# Patient Record
Sex: Female | Born: 1970 | Race: Black or African American | Hispanic: No | Marital: Single | Smoking: Current every day smoker
Health system: Southern US, Community
[De-identification: ages and names within clinical notes are randomized; demographics above are authoritative.]

## PROBLEM LIST (undated history)

## (undated) DIAGNOSIS — K259 Gastric ulcer, unspecified as acute or chronic, without hemorrhage or perforation: Secondary | ICD-10-CM

## (undated) HISTORY — PX: FINGER SURGERY: SHX640

---

## 2019-03-06 ENCOUNTER — Emergency Department (HOSPITAL_COMMUNITY)
Admission: EM | Admit: 2019-03-06 | Discharge: 2019-03-06 | Disposition: A | Discharge status: Home / Self Care | Payer: Self-pay | Attending: Emergency Medicine | Admitting: Emergency Medicine

## 2019-03-06 ENCOUNTER — Emergency Department (HOSPITAL_COMMUNITY): Payer: Self-pay

## 2019-03-06 ENCOUNTER — Encounter (HOSPITAL_COMMUNITY): Payer: Self-pay

## 2019-03-06 ENCOUNTER — Other Ambulatory Visit: Payer: Self-pay

## 2019-03-06 DIAGNOSIS — R42 Dizziness and giddiness: Secondary | ICD-10-CM | POA: Insufficient documentation

## 2019-03-06 DIAGNOSIS — F419 Anxiety disorder, unspecified: Secondary | ICD-10-CM | POA: Insufficient documentation

## 2019-03-06 LAB — COMPREHENSIVE METABOLIC PANEL
ALT: 15 U/L (ref 0–44)
AST: 19 U/L (ref 15–41)
Albumin: 3 g/dL — ABNORMAL LOW (ref 3.5–5.0)
Alkaline Phosphatase: 46 U/L (ref 38–126)
Anion gap: 4 — ABNORMAL LOW (ref 5–15)
BUN: 11 mg/dL (ref 6–20)
CO2: 25 mmol/L (ref 22–32)
Calcium: 8.2 mg/dL — ABNORMAL LOW (ref 8.9–10.3)
Chloride: 107 mmol/L (ref 98–111)
Creatinine, Ser: 0.8 mg/dL (ref 0.44–1.00)
GFR calc Af Amer: >60 mL/min (ref >60)
GFR calc non Af Amer: >60 mL/min (ref >60)
Glucose, Bld: 90 mg/dL (ref 70–99)
Potassium: 4.1 mmol/L (ref 3.5–5.1)
Sodium: 136 mmol/L (ref 135–145)
Total Bilirubin: 0.7 mg/dL (ref 0.3–1.2)
Total Protein: 5.6 g/dL — ABNORMAL LOW (ref 6.5–8.1)

## 2019-03-06 LAB — CBC WITH DIFFERENTIAL/PLATELET
Abs Immature Granulocytes: 0.04 10*3/uL (ref 0.00–0.07)
Basophils Absolute: 0 10*3/uL (ref 0.0–0.1)
Basophils Relative: 0 %
Eosinophils Absolute: 0.1 10*3/uL (ref 0.0–0.5)
Eosinophils Relative: 1 %
HCT: 36.5 % (ref 36.0–46.0)
Hemoglobin: 12.2 g/dL (ref 12.0–15.0)
Immature Granulocytes: 0 %
Lymphocytes Relative: 15 %
Lymphs Abs: 1.8 10*3/uL (ref 0.7–4.0)
MCH: 29.7 pg (ref 26.0–34.0)
MCHC: 33.4 g/dL (ref 30.0–36.0)
MCV: 88.8 fL (ref 80.0–100.0)
Monocytes Absolute: 0.8 10*3/uL (ref 0.1–1.0)
Monocytes Relative: 6 %
Neutro Abs: 9.2 10*3/uL — ABNORMAL HIGH (ref 1.7–7.7)
Neutrophils Relative %: 78 %
Platelets: 297 10*3/uL (ref 150–400)
RBC: 4.11 MIL/uL (ref 3.87–5.11)
RDW: 14.7 % (ref 11.5–15.5)
WBC: 12 10*3/uL — ABNORMAL HIGH (ref 4.0–10.5)
nRBC: 0 % (ref 0.0–0.2)

## 2019-03-06 LAB — MAGNESIUM: Magnesium: 1.8 mg/dL (ref 1.7–2.4)

## 2019-03-06 LAB — I-STAT BETA HCG BLOOD, ED (MC, WL, AP ONLY): I-stat hCG, quantitative: <5 m[IU]/mL (ref <5)

## 2019-03-06 MED ORDER — SODIUM CHLORIDE 0.9 % IV BOLUS
1000.0000 mL | Freq: Once | INTRAVENOUS | Status: AC
  Administered 2019-03-06: 08:00:00 1000 mL via INTRAVENOUS

## 2019-03-06 MED ORDER — HYDROXYZINE HCL 25 MG PO TABS: 25.0000 mg | ORAL_TABLET | Freq: Four times a day (QID) | ORAL | 0 refills | Status: DC

## 2019-03-06 MED ORDER — METHOCARBAMOL 500 MG PO TABS: 500.0000 mg | ORAL_TABLET | Freq: Two times a day (BID) | ORAL | 0 refills | Status: DC

## 2019-03-06 MED ORDER — DICLOFENAC SODIUM 1 % EX GEL: 4.0000 g | Freq: Four times a day (QID) | CUTANEOUS | 2 refills | Status: DC

## 2019-03-06 NOTE — ED Provider Notes (Signed)
[facility] DEPT Provider Note   CSN: [id number] Arrival date & time: 03/06/19  0601     History Chief Complaint  Patient presents with  . Spasms  . Dizziness    Rebecca Fields is a 49 y.o. female.  HPI      Rebecca Fields is a 49 y.o. female, patient with no pertinent past medical history, presenting to the ED with lightheadedness that began this morning upon getting out of bed.  She notes symptoms were worse with standing and improved with lying down.  She has occasional sensation of room spinning, but this is not constant.  She mentions there is also a feeling of anxiety that worsens her symptoms and she feels better when she is able to take deep breaths and calm herself.  She reports she has been under a great deal of stress the last several months and after talking with the patient for some time, she finally tells me, "I actually think my symptoms are from anxiety." Denies SI/HI. She notes anterior lower leg cramping that typically occurs at night and has been recurrent over the last several months. Regularly donates plasma and episodes have occurred surrounding donating previously. Denies fever/chills, recent illness, chest pain, shortness of breath, cough, abdominal pain, N/V/D, urinary symptoms, falls/trauma, vision abnormalities, numbness, weakness, headaches, pain in the lower extremities with walking, or any other complaints.  History reviewed. No pertinent past medical history.  There are no problems to display for this patient.   History reviewed. No pertinent surgical history.   OB History   No obstetric history on file.     History reviewed. No pertinent family history.  Social History   Tobacco Use  . Smoking status: Not on file  Substance Use Topics  . Alcohol use: Not on file  . Drug use: Not on file    Home Medications Prior to Admission medications   Medication Sig Start Date End Date Taking? Authorizing Provider    Ascorbic Acid (VITAMIN C PO) Take 1 tablet by mouth daily as needed (For immune system.).   Yes [provider]  ibuprofen (ADVIL) 200 MG tablet Take 800-1,000 mg by mouth every 6 (six) hours as needed for headache or mild pain.    Yes [provider]  Menthol, Topical Analgesic, (BIOFREEZE EX) Apply 1 patch topically daily as needed (For pain.). Biofreeze Patch   Yes [provider]  Menthol, Topical Analgesic, (BIOFREEZE EX) Apply 1 application topically daily as needed (For pain.). Biofreeze Cream   Yes [provider]  Multiple Vitamin (MULTIVITAMIN WITH MINERALS) TABS tablet Take 1 tablet by mouth daily.   Yes [provider]  diclofenac Sodium (VOLTAREN) 1 % GEL Apply 4 g topically 4 (four) times daily. 03/06/19   Dhanvin Szeto C, PA-C  hydrOXYzine (ATARAX/VISTARIL) 25 MG tablet Take 1 tablet (25 mg total) by mouth every 6 (six) hours. 03/06/19   Holden Maniscalco C, PA-C  methocarbamol (ROBAXIN) 500 MG tablet Take 1 tablet (500 mg total) by mouth 2 (two) times daily. 03/06/19   Moustafa Mossa C, PA-C    Allergies    Patient has no known allergies.  Review of Systems   Review of Systems  Constitutional: Negative for chills and fever.  Eyes: Negative for visual disturbance.  Respiratory: Negative for cough and shortness of breath.   Cardiovascular: Negative for chest pain.  Gastrointestinal: Negative for abdominal pain, blood in stool, diarrhea, nausea and vomiting.  Genitourinary: Negative for dysuria, flank pain, frequency and  hematuria.  Musculoskeletal: Positive for myalgias.  Neurological: Positive for dizziness and light-headedness. Negative for seizures, syncope, facial asymmetry, speech difficulty, weakness, numbness and headaches.  All other systems reviewed and are negative.   Physical Exam Updated Vital Signs BP (!) 151/88 (BP Location: Left Arm)   Pulse 71   Temp 98.1 F (36.7 C) (Oral)   Resp 16   Ht 5\' 5"  (1.651 m)   Wt 93.4 kg   SpO2  100%   BMI 34.28 kg/m   Physical Exam Vitals and nursing note reviewed.  Constitutional:      General: She is not in acute distress.    Appearance: She is well-developed. She is not diaphoretic.  HENT:     Head: Normocephalic and atraumatic.     Mouth/Throat:     Mouth: Mucous membranes are moist.     Pharynx: Oropharynx is clear.  Eyes:     Conjunctiva/sclera: Conjunctivae normal.  Cardiovascular:     Rate and Rhythm: Normal rate and regular rhythm.     Pulses: Normal pulses.          Radial pulses are 2+ on the right side and 2+ on the left side.       Dorsalis pedis pulses are 2+ on the right side and 2+ on the left side.       Posterior tibial pulses are 2+ on the right side and 2+ on the left side.     Heart sounds: Normal heart sounds.     Comments: Tactile temperature in the extremities appropriate and equal bilaterally. Pulmonary:     Effort: Pulmonary effort is normal. No respiratory distress.     Breath sounds: Normal breath sounds.  Abdominal:     Palpations: Abdomen is soft.     Tenderness: There is no abdominal tenderness. There is no guarding.  Musculoskeletal:     Cervical back: Neck supple.     Right lower leg: No edema.     Left lower leg: No edema.     Comments: No calf tenderness, erythema, edema, or increased warmth.  Lymphadenopathy:     Cervical: No cervical adenopathy.  Skin:    General: Skin is warm and dry.  Neurological:     Mental Status: She is alert and oriented to person, place, and time.     Comments: No noted acute cognitive deficit. Sensation grossly intact to light touch in the extremities.   Grip strengths equal bilaterally.   Strength 5/5 in all extremities.  No gait disturbance.  Coordination intact.  Cranial nerves III-XII grossly intact.  Handles oral secretions without noted difficulty.  No noted phonation or speech deficit. No facial droop.   HINTS-Plus Exam Head Impulse: Abnormal, thus reassuring Nystagmus: No nystagmus  noted. No bidirectional, rotation, or vertical nystagmus noted. Test of Skew: No abnormal skew Hearing: No noted acute hearing deficit with finger rub testing    Psychiatric:        Mood and Affect: Mood and affect normal.        Speech: Speech normal.        Behavior: Behavior normal.     ED Results / Procedures / Treatments   Labs (all labs ordered are listed, but only abnormal results are displayed) Labs Reviewed  CBC WITH DIFFERENTIAL/PLATELET - Abnormal; Notable for the following components:      Result Value   WBC 12.0 (*)    Neutro Abs 9.2 (*)    All other components within normal limits  COMPREHENSIVE METABOLIC  PANEL - Abnormal; Notable for the following components:   Calcium 8.2 (*)    Total Protein 5.6 (*)    Albumin 3.0 (*)    Anion gap 4 (*)    All other components within normal limits  MAGNESIUM  I-STAT BETA HCG BLOOD, ED ([facility], [facility], [facility] ONLY)    EKG EKG Interpretation  Date/Time:  Tuesday March 06 2019 07:22:44 EST Ventricular Rate:  63 PR Interval:    QRS Duration: 93 QT Interval:  432 QTC Calculation: 443 R Axis:   35 Text Interpretation: Sinus rhythm No significant change since last tracing Confirmed by Gwyneth Sprout ([id number]) on 03/06/2019 7:37:31 AM   Radiology CT Head Wo Contrast  Result Date: 03/06/2019 CLINICAL DATA:  Dizziness. EXAM: CT HEAD WITHOUT CONTRAST TECHNIQUE: Contiguous axial images were obtained from the base of the skull through the vertex without intravenous contrast. COMPARISON:  None. FINDINGS: Brain: Normal appearing cerebral hemispheres and posterior fossa structures. Normal size and position of the ventricles. No intracranial hemorrhage, mass lesion or CT evidence of acute infarction. Vascular: No hyperdense vessel or unexpected calcification. Skull: Normal. Negative for fracture or focal lesion. Sinuses/Orbits: Unremarkable. Other: None. IMPRESSION: Normal examination. Electronically Signed   By: Beckie Salts M.D.   On: 03/06/2019  10:40    Procedures Procedures (including critical care time)  Medications Ordered in ED Medications  sodium chloride 0.9 % bolus 1,000 mL (0 mLs Intravenous Stopped 03/06/19 1119)    ED Course  I have reviewed the triage vital signs and the nursing notes.  Pertinent labs & imaging results that were available during my care of the patient were reviewed by me and considered in my medical decision making (see chart for details).  Clinical Course as of Mar 06 1152  Tue Mar 06, 2019  0240 Patient states she feels much better.   [SJ]    Clinical Course User Index [SJ] Noelia Lenart, Hillard Danker, PA-C   MDM Rules/Calculators/A&P                      Patient presents with episodes of anxiety and positional lightheadedness. Patient is nontoxic appearing, afebrile, not tachycardic, not tachypneic, not hypotensive, maintains excellent SPO2 on room air, and is in no apparent distress.  Work-up here overall reassuring. Discussed anxiety. Has been trying to use relaxation techniques and mindfulness exercises. We discussed additionally seeing a PCP and a counselor on this matter.  The patient was given instructions for home care as well as return precautions. Patient voices understanding of these instructions, accepts the plan, and is comfortable with discharge.   Vitals:   03/06/19 0611 03/06/19 0647 03/06/19 0723  BP: (!) 151/88  (!) 122/100  Pulse: 71  64  Resp: 16  18  Temp: 98.1 F (36.7 C)    TempSrc: Oral    SpO2: 100%  100%  Weight:  93.4 kg   Height:  5\' 5"  (1.651 m)    Orthostatic vital signs reassuring, but would not import into my note.   Orthostatic Vital Signs Orthostatic Lying  BP- Lying:158/91 (!) Pulse- Lying:65 Orthostatic Sitting BP- Sitting:167/83 Pulse- Sitting:64 Orthostatic Standing at 0 minutes BP- Standing at 0 minutes:160/98 (!) Pulse- Standing at 0 minutes:68 Orthostatic Standing at 3 minutes BP- Standing at 3 minutes:179/88 Pulse- Standing at 3  minutes:63  Final Clinical Impression(s) / ED Diagnoses Final diagnoses:  Lightheadedness  Anxiousness    Rx / DC Orders ED Discharge Orders         Ordered  diclofenac Sodium (VOLTAREN) 1 % GEL  4 times daily     03/06/19 1052    methocarbamol (ROBAXIN) 500 MG tablet  2 times daily     03/06/19 1052    hydrOXYzine (ATARAX/VISTARIL) 25 MG tablet  Every 6 hours     03/06/19 1052           Anselm Pancoast, PA-C 03/07/19 1156    Gwyneth Sprout, MD 03/09/19 1644

## 2019-03-06 NOTE — ED Notes (Signed)
Provider at bedside

## 2019-03-06 NOTE — ED Triage Notes (Signed)
Pt states that she has been having muscle spasms at night lately. She took 800mg  of ibuprofen last night before bed and woke up feeling hot and diaphoretic with a stomach ache. She states that, about a month ago, one of these "spells" happened and caused her to fall. She states that she felt a little better after a cold shower, but still doesn't feel right. A&Ox4. Ambulatory.

## 2019-03-06 NOTE — ED Notes (Signed)
Pt ambulated to the restroom independently without issues.  

## 2019-03-06 NOTE — Discharge Instructions (Addendum)
  Your work-up today was reassuring. For your lightheadedness, we recommend staying well-hydrated and eating regular meals. Try to reduce stress as much as possible.  This may include removing yourself from a stressful situation, avoiding electronics or social media, etc. Recommend involving yourself in counseling or speaking with a therapist.  These can typically be set up through a PCP. It would be helpful to also start to include physical activity in your daily regimen.  Exercise has been noted to decrease stress and anxiety. Hydroxyzine: May use the hydroxyzine, as needed, for anxiety.  Use caution as hydroxyzine can make you drowsy. It is very important to get good sleep.  For most adults, this means at least 7 hours of continuous sleep a night.  Avoiding stress and screens, such as phones and television before sleep can help facilitate this.  Leg pain and cramping: Diclofenac gel: This is a topical anti-inflammatory medication and can be applied directly to the painful region.  Do not use on the face or genitals.  This medication may be used as an alternative to oral anti-inflammatory medications, such as ibuprofen or naproxen. Methocarbamol: Methocarbamol (generic for Robaxin) is a muscle relaxer and can help relieve stiff muscles or muscle spasms.  Do not drive or perform other dangerous activities while taking this medication as it can cause drowsiness as well as changes in reaction time and judgement.

## 2019-03-06 NOTE — ED Notes (Signed)
Pt ambulated independently to/from CT with no issues.

## 2019-03-19 NOTE — Progress Notes (Signed)
Patient ID: Rebecca Fields, female   DOB: [birth year], 49 y.o.   MRN: [id number]  Virtual Visit via Telephone Note  I connected with Julaine Fusi on 03/21/19 at  2:30 PM EST by telephone and verified that I am speaking with the correct person using two identifiers.   I discussed the limitations, risks, security and privacy concerns of performing an evaluation and management service by telephone and the availability of in person appointments. I also discussed with the patient that there may be a patient responsible charge related to this service. The patient expressed understanding and agreed to proceed.   History of Present Illness: after ED visit for dizziness 03/06/2019.  Work-up unremarkable.  No further episodes.  No CP.    Today she is complaining of L leg pain from the knee down.  It started around November when she started working and having to stand on a concrete floor.  When she was off for a couple of weeks the pain improved.  The pain is along the front part of her lower leg(not in the back of the leg).  No calf pain or tenderness.  She was unable to get Rx from ED filled due to cost.   Hasn't checked BP OOO but was high in ED.    Donates plasma twice a week.    From ED A/P: Patient presents with episodes of anxiety and positional lightheadedness. Patient is nontoxic appearing, afebrile, not tachycardic, not tachypneic, not hypotensive, maintains excellent SPO2 on room air, and is in no apparent distress.  Work-up here overall reassuring. Discussed anxiety. Has been trying to use relaxation techniques and mindfulness exercises. We discussed additionally seeing a PCP and a counselor on this matter.    Observations/Objective:  NAD.  A&Ox3   Assessment and Plan: 1. Pain of left lower extremity Sent new Rx to our pharmacy so she can pick it up-methocarbamol and topical voltaren.  Also advised tylenol.    2. Elevated blood pressure reading Check BP at least 3 times weekly and record and  bring to next visit.    3. Hospital discharge follow-up I advised her to stop plasma donations for the time being and see if the dizziness episodes resolve.  Also, she should make sure and drink ample water.     Follow Up Instructions: Assign PCP 4-6 weeks   I discussed the assessment and treatment plan with the patient. The patient was provided an opportunity to ask questions and all were answered. The patient agreed with the plan and demonstrated an understanding of the instructions.   The patient was advised to call back or seek an in-person evaluation if the symptoms worsen or if the condition fails to improve as anticipated.  I provided 15 minutes of non-face-to-face time during this encounter.   Georgian Co, PA-C

## 2019-03-21 ENCOUNTER — Other Ambulatory Visit: Payer: Self-pay

## 2019-03-21 ENCOUNTER — Ambulatory Visit: Payer: Self-pay | Attending: Family Medicine | Admitting: Physician Assistant

## 2019-03-21 DIAGNOSIS — R030 Elevated blood-pressure reading, without diagnosis of hypertension: Secondary | ICD-10-CM

## 2019-03-21 DIAGNOSIS — R42 Dizziness and giddiness: Secondary | ICD-10-CM

## 2019-03-21 DIAGNOSIS — M79605 Pain in left leg: Secondary | ICD-10-CM

## 2019-03-21 DIAGNOSIS — Z09 Encounter for follow-up examination after completed treatment for conditions other than malignant neoplasm: Secondary | ICD-10-CM

## 2019-03-21 MED ORDER — METHOCARBAMOL 500 MG PO TABS: 1000.0000 mg | ORAL_TABLET | Freq: Three times a day (TID) | ORAL | 0 refills | Status: DC | PRN

## 2019-03-21 MED ORDER — DICLOFENAC SODIUM 1 % EX GEL: 4.0000 g | Freq: Four times a day (QID) | CUTANEOUS | 2 refills | Status: DC

## 2019-03-21 MED FILL — DICLOFENAC SODIUM 1 % GEL: 1 | 6 days supply | Qty: 100 | Fill #0

## 2019-03-21 MED FILL — METHOCARBAMOL 500 MG TABS: 500 | 15 days supply | Qty: 90 | Fill #0

## 2019-03-21 NOTE — Progress Notes (Signed)
Hospital F /u   c /o muscle spasms throughout the day in left leg with swelling on and off Patient stated Left leg is bigger than the right one

## 2020-03-24 ENCOUNTER — Other Ambulatory Visit: Payer: Self-pay

## 2020-03-24 ENCOUNTER — Ambulatory Visit: Payer: Self-pay | Attending: Internal Medicine | Admitting: Internal Medicine

## 2020-04-03 ENCOUNTER — Other Ambulatory Visit: Payer: Self-pay

## 2020-04-03 ENCOUNTER — Emergency Department (HOSPITAL_BASED_OUTPATIENT_CLINIC_OR_DEPARTMENT_OTHER): Payer: Self-pay

## 2020-04-03 ENCOUNTER — Encounter (HOSPITAL_COMMUNITY)
Admission: EM | Disposition: A | Discharge status: Home / Self Care | Payer: Self-pay | Source: Home / Self Care | Attending: Emergency Medicine

## 2020-04-03 ENCOUNTER — Observation Stay (HOSPITAL_BASED_OUTPATIENT_CLINIC_OR_DEPARTMENT_OTHER)
Admission: EM | Admit: 2020-04-03 | Discharge: 2020-04-04 | Disposition: A | Discharge status: Home / Self Care | Payer: Self-pay | Attending: Surgery | Admitting: Surgery

## 2020-04-03 ENCOUNTER — Emergency Department (HOSPITAL_COMMUNITY): Payer: Self-pay | Admitting: Anesthesiology

## 2020-04-03 ENCOUNTER — Emergency Department (HOSPITAL_COMMUNITY)
Admission: EM | Admit: 2020-04-03 | Discharge: 2020-04-03 | Disposition: A | Discharge status: Home / Self Care | Payer: Self-pay | Attending: Emergency Medicine | Admitting: Emergency Medicine

## 2020-04-03 ENCOUNTER — Encounter (HOSPITAL_BASED_OUTPATIENT_CLINIC_OR_DEPARTMENT_OTHER): Payer: Self-pay

## 2020-04-03 ENCOUNTER — Encounter (HOSPITAL_COMMUNITY): Payer: Self-pay

## 2020-04-03 DIAGNOSIS — F17200 Nicotine dependence, unspecified, uncomplicated: Secondary | ICD-10-CM | POA: Insufficient documentation

## 2020-04-03 DIAGNOSIS — R1110 Vomiting, unspecified: Secondary | ICD-10-CM | POA: Insufficient documentation

## 2020-04-03 DIAGNOSIS — Z5321 Procedure and treatment not carried out due to patient leaving prior to being seen by health care provider: Secondary | ICD-10-CM | POA: Insufficient documentation

## 2020-04-03 DIAGNOSIS — K3580 Unspecified acute appendicitis: Principal | ICD-10-CM | POA: Diagnosis present

## 2020-04-03 DIAGNOSIS — Z20822 Contact with and (suspected) exposure to covid-19: Secondary | ICD-10-CM | POA: Insufficient documentation

## 2020-04-03 DIAGNOSIS — K353 Acute appendicitis with localized peritonitis, without perforation or gangrene: Secondary | ICD-10-CM

## 2020-04-03 DIAGNOSIS — Z79899 Other long term (current) drug therapy: Secondary | ICD-10-CM | POA: Insufficient documentation

## 2020-04-03 DIAGNOSIS — R109 Unspecified abdominal pain: Secondary | ICD-10-CM | POA: Insufficient documentation

## 2020-04-03 HISTORY — DX: Gastric ulcer, unspecified as acute or chronic, without hemorrhage or perforation: K25.9

## 2020-04-03 HISTORY — PX: LAPAROSCOPIC APPENDECTOMY: SHX408

## 2020-04-03 LAB — URINALYSIS, ROUTINE W REFLEX MICROSCOPIC
Bilirubin Urine: NEGATIVE
Glucose, UA: NEGATIVE mg/dL
Hgb urine dipstick: NEGATIVE
Ketones, ur: NEGATIVE mg/dL
Leukocytes,Ua: NEGATIVE
Nitrite: POSITIVE — AB
Protein, ur: NEGATIVE mg/dL
Specific Gravity, Urine: 1.013 (ref 1.005–1.030)
pH: 6 (ref 5.0–8.0)

## 2020-04-03 LAB — CBC
HCT: 38.4 % (ref 36.0–46.0)
Hemoglobin: 12.8 g/dL (ref 12.0–15.0)
MCH: 29.8 pg (ref 26.0–34.0)
MCHC: 33.3 g/dL (ref 30.0–36.0)
MCV: 89.3 fL (ref 80.0–100.0)
Platelets: 389 10*3/uL (ref 150–400)
RBC: 4.3 MIL/uL (ref 3.87–5.11)
RDW: 14.6 % (ref 11.5–15.5)
WBC: 17.3 10*3/uL — ABNORMAL HIGH (ref 4.0–10.5)
nRBC: 0 % (ref 0.0–0.2)

## 2020-04-03 LAB — COMPREHENSIVE METABOLIC PANEL
ALT: 13 U/L (ref 0–44)
AST: 17 U/L (ref 15–41)
Albumin: 3.4 g/dL — ABNORMAL LOW (ref 3.5–5.0)
Alkaline Phosphatase: 54 U/L (ref 38–126)
Anion gap: 7 (ref 5–15)
BUN: 8 mg/dL (ref 6–20)
CO2: 26 mmol/L (ref 22–32)
Calcium: 8.6 mg/dL — ABNORMAL LOW (ref 8.9–10.3)
Chloride: 104 mmol/L (ref 98–111)
Creatinine, Ser: 0.81 mg/dL (ref 0.44–1.00)
GFR, Estimated: >60 mL/min (ref >60)
Glucose, Bld: 115 mg/dL — ABNORMAL HIGH (ref 70–99)
Potassium: 4 mmol/L (ref 3.5–5.1)
Sodium: 137 mmol/L (ref 135–145)
Total Bilirubin: 0.8 mg/dL (ref 0.3–1.2)
Total Protein: 6.4 g/dL — ABNORMAL LOW (ref 6.5–8.1)

## 2020-04-03 LAB — I-STAT BETA HCG BLOOD, ED (MC, WL, AP ONLY): I-stat hCG, quantitative: <5 m[IU]/mL (ref <5)

## 2020-04-03 LAB — RESP PANEL BY RT-PCR (FLU A&B, COVID) ARPGX2
Influenza A by PCR: NEGATIVE
Influenza B by PCR: NEGATIVE
SARS Coronavirus 2 by RT PCR: NEGATIVE

## 2020-04-03 LAB — LIPASE, BLOOD: Lipase: 25 U/L (ref 11–51)

## 2020-04-03 LAB — HIV ANTIBODY (ROUTINE TESTING W REFLEX): HIV Screen 4th Generation wRfx: NONREACTIVE

## 2020-04-03 SURGERY — APPENDECTOMY, LAPAROSCOPIC
Anesthesia: General | Site: Abdomen

## 2020-04-03 MED ORDER — MORPHINE SULFATE (PF) 2 MG/ML IV SOLN
2.0000 mg | INTRAVENOUS | Status: DC | PRN
  Administered 2020-04-03 – 2020-04-04 (×2): 2 mg via INTRAVENOUS
  Filled 2020-04-03 (×2): qty 1

## 2020-04-03 MED ORDER — ROCURONIUM BROMIDE 10 MG/ML (PF) SYRINGE
PREFILLED_SYRINGE | INTRAVENOUS | Status: DC | PRN
  Administered 2020-04-03: 60 mg via INTRAVENOUS

## 2020-04-03 MED ORDER — ACETAMINOPHEN 10 MG/ML IV SOLN
INTRAVENOUS | Status: AC
  Filled 2020-04-03: qty 100

## 2020-04-03 MED ORDER — ENOXAPARIN SODIUM 40 MG/0.4ML ~~LOC~~ SOLN
40.0000 mg | SUBCUTANEOUS | Status: DC
  Administered 2020-04-04: 40 mg via SUBCUTANEOUS
  Filled 2020-04-03: qty 0.4

## 2020-04-03 MED ORDER — SIMETHICONE 80 MG PO CHEW: 40.0000 mg | CHEWABLE_TABLET | Freq: Four times a day (QID) | ORAL | Status: DC | PRN

## 2020-04-03 MED ORDER — DIPHENHYDRAMINE HCL 50 MG/ML IJ SOLN: 25.0000 mg | Freq: Four times a day (QID) | INTRAMUSCULAR | Status: DC | PRN

## 2020-04-03 MED ORDER — SODIUM CHLORIDE 0.9 % IV SOLN: INTRAVENOUS | Status: DC

## 2020-04-03 MED ORDER — BUPIVACAINE HCL (PF) 0.25 % IJ SOLN
INTRAMUSCULAR | Status: DC | PRN
  Administered 2020-04-03: 20 mL

## 2020-04-03 MED ORDER — SUGAMMADEX SODIUM 200 MG/2ML IV SOLN
INTRAVENOUS | Status: DC | PRN
  Administered 2020-04-03 (×2): 100 mg via INTRAVENOUS
  Administered 2020-04-03: 150 mg via INTRAVENOUS
  Administered 2020-04-03: 100 mg via INTRAVENOUS

## 2020-04-03 MED ORDER — METRONIDAZOLE IN NACL 5-0.79 MG/ML-% IV SOLN
500.0000 mg | Freq: Once | INTRAVENOUS | Status: AC
  Administered 2020-04-03: 500 mg via INTRAVENOUS
  Filled 2020-04-03: qty 100

## 2020-04-03 MED ORDER — FENTANYL CITRATE (PF) 250 MCG/5ML IJ SOLN
INTRAMUSCULAR | Status: AC
  Filled 2020-04-03: qty 5

## 2020-04-03 MED ORDER — ACETAMINOPHEN 10 MG/ML IV SOLN
INTRAVENOUS | Status: DC | PRN
  Administered 2020-04-03: 1000 mg via INTRAVENOUS

## 2020-04-03 MED ORDER — SODIUM CHLORIDE 0.9 % IV SOLN
1.0000 g | Freq: Once | INTRAVENOUS | Status: AC
  Administered 2020-04-03: 1 g via INTRAVENOUS
  Filled 2020-04-03: qty 10

## 2020-04-03 MED ORDER — ONDANSETRON HCL 4 MG/2ML IJ SOLN
INTRAMUSCULAR | Status: AC
  Filled 2020-04-03: qty 2

## 2020-04-03 MED ORDER — FENTANYL CITRATE (PF) 100 MCG/2ML IJ SOLN
25.0000 ug | INTRAMUSCULAR | Status: DC | PRN
Start: 2020-04-03 — End: 2020-04-03

## 2020-04-03 MED ORDER — METOPROLOL TARTRATE 5 MG/5ML IV SOLN: 5.0000 mg | Freq: Four times a day (QID) | INTRAVENOUS | Status: DC | PRN

## 2020-04-03 MED ORDER — ONDANSETRON HCL 4 MG/2ML IJ SOLN
4.0000 mg | Freq: Once | INTRAMUSCULAR | Status: AC
  Administered 2020-04-03: 4 mg via INTRAVENOUS
  Filled 2020-04-03: qty 2

## 2020-04-03 MED ORDER — DEXAMETHASONE SODIUM PHOSPHATE 10 MG/ML IJ SOLN
INTRAMUSCULAR | Status: AC
  Filled 2020-04-03: qty 1

## 2020-04-03 MED ORDER — PANTOPRAZOLE SODIUM 40 MG IV SOLR
40.0000 mg | Freq: Once | INTRAVENOUS | Status: AC
  Administered 2020-04-03: 40 mg via INTRAVENOUS
  Filled 2020-04-03: qty 40

## 2020-04-03 MED ORDER — SCOPOLAMINE 1 MG/3DAYS TD PT72
MEDICATED_PATCH | TRANSDERMAL | Status: DC | PRN
  Administered 2020-04-03: 1 via TRANSDERMAL

## 2020-04-03 MED ORDER — BUPIVACAINE HCL (PF) 0.25 % IJ SOLN
INTRAMUSCULAR | Status: AC
  Filled 2020-04-03: qty 30

## 2020-04-03 MED ORDER — SODIUM CHLORIDE 0.9 % IV SOLN
1.0000 g | INTRAVENOUS | Status: AC
  Administered 2020-04-03: 1 g via INTRAVENOUS

## 2020-04-03 MED ORDER — SCOPOLAMINE 1 MG/3DAYS TD PT72
1.0000 | MEDICATED_PATCH | TRANSDERMAL | Status: DC
  Filled 2020-04-03: qty 1

## 2020-04-03 MED ORDER — ONDANSETRON HCL 4 MG/2ML IJ SOLN
INTRAMUSCULAR | Status: DC | PRN
  Administered 2020-04-03: 4 mg via INTRAVENOUS

## 2020-04-03 MED ORDER — OXYCODONE HCL 5 MG PO TABS: 5.0000 mg | ORAL_TABLET | Freq: Once | ORAL | Status: DC | PRN

## 2020-04-03 MED ORDER — POLYETHYLENE GLYCOL 3350 17 G PO PACK: 17.0000 g | PACK | Freq: Every day | ORAL | Status: DC | PRN

## 2020-04-03 MED ORDER — PANTOPRAZOLE SODIUM 40 MG IV SOLR
40.0000 mg | Freq: Every day | INTRAVENOUS | Status: DC
  Administered 2020-04-03: 40 mg via INTRAVENOUS
  Filled 2020-04-03: qty 40

## 2020-04-03 MED ORDER — DOCUSATE SODIUM 100 MG PO CAPS
100.0000 mg | ORAL_CAPSULE | Freq: Two times a day (BID) | ORAL | Status: DC
  Administered 2020-04-03: 100 mg via ORAL
  Filled 2020-04-03: qty 1

## 2020-04-03 MED ORDER — PROPOFOL 10 MG/ML IV BOLUS
INTRAVENOUS | Status: AC
  Filled 2020-04-03: qty 40

## 2020-04-03 MED ORDER — SUCCINYLCHOLINE CHLORIDE 200 MG/10ML IV SOSY
PREFILLED_SYRINGE | INTRAVENOUS | Status: DC | PRN
  Administered 2020-04-03: 100 mg via INTRAVENOUS

## 2020-04-03 MED ORDER — OXYCODONE HCL 5 MG/5ML PO SOLN: 5.0000 mg | Freq: Once | ORAL | Status: DC | PRN

## 2020-04-03 MED ORDER — ACETAMINOPHEN 500 MG PO TABS
1000.0000 mg | ORAL_TABLET | Freq: Four times a day (QID) | ORAL | Status: DC | PRN
  Administered 2020-04-04: 1000 mg via ORAL
  Filled 2020-04-03: qty 2

## 2020-04-03 MED ORDER — LACTATED RINGERS IV SOLN: INTRAVENOUS | Status: DC | PRN

## 2020-04-03 MED ORDER — MORPHINE SULFATE (PF) 4 MG/ML IV SOLN
4.0000 mg | Freq: Once | INTRAVENOUS | Status: AC
  Administered 2020-04-03: 4 mg via INTRAVENOUS
  Filled 2020-04-03: qty 1

## 2020-04-03 MED ORDER — SODIUM CHLORIDE 0.9 % IV SOLN
1.0000 g | Freq: Once | INTRAVENOUS | Status: DC
  Filled 2020-04-03: qty 10

## 2020-04-03 MED ORDER — LACTATED RINGERS IV BOLUS
1000.0000 mL | Freq: Once | INTRAVENOUS | Status: AC
  Administered 2020-04-03: 1000 mL via INTRAVENOUS

## 2020-04-03 MED ORDER — PROMETHAZINE HCL 25 MG/ML IJ SOLN: 6.2500 mg | INTRAMUSCULAR | Status: DC | PRN

## 2020-04-03 MED ORDER — MIDAZOLAM HCL 2 MG/2ML IJ SOLN
INTRAMUSCULAR | Status: DC | PRN
  Administered 2020-04-03: 2 mg via INTRAVENOUS

## 2020-04-03 MED ORDER — KETOROLAC TROMETHAMINE 30 MG/ML IJ SOLN
INTRAMUSCULAR | Status: DC | PRN
  Administered 2020-04-03: 30 mg via INTRAVENOUS

## 2020-04-03 MED ORDER — KETOROLAC TROMETHAMINE 30 MG/ML IJ SOLN
INTRAMUSCULAR | Status: AC
  Filled 2020-04-03: qty 1

## 2020-04-03 MED ORDER — IOHEXOL 300 MG/ML  SOLN
100.0000 mL | Freq: Once | INTRAMUSCULAR | Status: AC | PRN
  Administered 2020-04-03: 100 mL via INTRAVENOUS

## 2020-04-03 MED ORDER — OXYCODONE HCL 5 MG PO TABS
5.0000 mg | ORAL_TABLET | ORAL | Status: DC | PRN
  Administered 2020-04-03: 10 mg via ORAL
  Filled 2020-04-03: qty 2

## 2020-04-03 MED ORDER — LIDOCAINE 2% (20 MG/ML) 5 ML SYRINGE
INTRAMUSCULAR | Status: DC | PRN
  Administered 2020-04-03: 60 mg via INTRAVENOUS
  Administered 2020-04-03: 40 mg via INTRAVENOUS

## 2020-04-03 MED ORDER — KETOROLAC TROMETHAMINE 30 MG/ML IJ SOLN: 30.0000 mg | Freq: Once | INTRAMUSCULAR | Status: DC

## 2020-04-03 MED ORDER — DEXMEDETOMIDINE (PRECEDEX) IN NS 20 MCG/5ML (4 MCG/ML) IV SYRINGE
PREFILLED_SYRINGE | INTRAVENOUS | Status: DC | PRN
  Administered 2020-04-03: 4 ug via INTRAVENOUS

## 2020-04-03 MED ORDER — ACETAMINOPHEN 500 MG PO TABS: 1000.0000 mg | ORAL_TABLET | Freq: Four times a day (QID) | ORAL | Status: DC

## 2020-04-03 MED ORDER — FENTANYL CITRATE (PF) 250 MCG/5ML IJ SOLN
INTRAMUSCULAR | Status: DC | PRN
  Administered 2020-04-03: 50 ug via INTRAVENOUS
  Administered 2020-04-03: 150 ug via INTRAVENOUS

## 2020-04-03 MED ORDER — DIPHENHYDRAMINE HCL 25 MG PO CAPS
25.0000 mg | ORAL_CAPSULE | Freq: Four times a day (QID) | ORAL | Status: DC | PRN
Start: 2020-04-03 — End: 2020-04-04

## 2020-04-03 MED ORDER — ONDANSETRON HCL 4 MG/2ML IJ SOLN: 4.0000 mg | Freq: Four times a day (QID) | INTRAMUSCULAR | Status: DC | PRN

## 2020-04-03 MED ORDER — MIDAZOLAM HCL 2 MG/2ML IJ SOLN
INTRAMUSCULAR | Status: AC
  Filled 2020-04-03: qty 2

## 2020-04-03 MED ORDER — PROPOFOL 10 MG/ML IV BOLUS
INTRAVENOUS | Status: DC | PRN
  Administered 2020-04-03: 200 mg via INTRAVENOUS

## 2020-04-03 MED ORDER — ROCURONIUM BROMIDE 10 MG/ML (PF) SYRINGE
PREFILLED_SYRINGE | INTRAVENOUS | Status: AC
  Filled 2020-04-03: qty 10

## 2020-04-03 MED ORDER — HYDROMORPHONE HCL 1 MG/ML IJ SOLN: 0.5000 mg | Freq: Once | INTRAMUSCULAR | Status: DC

## 2020-04-03 MED ORDER — DEXAMETHASONE SODIUM PHOSPHATE 10 MG/ML IJ SOLN
INTRAMUSCULAR | Status: DC | PRN
  Administered 2020-04-03: 5 mg via INTRAVENOUS

## 2020-04-03 MED ORDER — SODIUM CHLORIDE 0.9 % IR SOLN
Status: DC | PRN
  Administered 2020-04-03: 1000 mL

## 2020-04-03 MED ORDER — LIDOCAINE 2% (20 MG/ML) 5 ML SYRINGE
INTRAMUSCULAR | Status: AC
  Filled 2020-04-03: qty 5

## 2020-04-03 MED ORDER — ACETAMINOPHEN 10 MG/ML IV SOLN: 1000.0000 mg | Freq: Once | INTRAVENOUS | Status: DC | PRN

## 2020-04-03 MED ORDER — METHOCARBAMOL 500 MG PO TABS: 500.0000 mg | ORAL_TABLET | Freq: Four times a day (QID) | ORAL | Status: DC | PRN

## 2020-04-03 MED ORDER — ACETAMINOPHEN 500 MG PO TABS: 1000.0000 mg | ORAL_TABLET | ORAL | Status: DC

## 2020-04-03 MED ORDER — SODIUM CHLORIDE 0.9 % IV SOLN
2.0000 g | INTRAVENOUS | Status: DC
  Filled 2020-04-03: qty 20

## 2020-04-03 MED ORDER — 0.9 % SODIUM CHLORIDE (POUR BTL) OPTIME
TOPICAL | Status: DC | PRN
  Administered 2020-04-03: 1000 mL

## 2020-04-03 SURGICAL SUPPLY — 37 items
APPLIER CLIP 5 13 M/L LIGAMAX5 (MISCELLANEOUS)
APPLIER CLIP ROT 10 11.4 M/L (STAPLE)
CANISTER SUCT 3000ML PPV (MISCELLANEOUS) ×2 IMPLANT
CHLORAPREP W/TINT 26 (MISCELLANEOUS) ×2 IMPLANT
CLIP APPLIE 5 13 M/L LIGAMAX5 (MISCELLANEOUS) IMPLANT
CLIP APPLIE ROT 10 11.4 M/L (STAPLE) IMPLANT
COVER SURGICAL LIGHT HANDLE (MISCELLANEOUS) ×2 IMPLANT
COVER WAND RF STERILE (DRAPES) IMPLANT
CUTTER FLEX LINEAR 45M (STAPLE) ×2 IMPLANT
DERMABOND ADVANCED (GAUZE/BANDAGES/DRESSINGS) ×1
DERMABOND ADVANCED .7 DNX12 (GAUZE/BANDAGES/DRESSINGS) ×1 IMPLANT
ELECT REM PT RETURN 9FT ADLT (ELECTROSURGICAL) ×2
ELECTRODE REM PT RTRN 9FT ADLT (ELECTROSURGICAL) ×1 IMPLANT
GLOVE SURG SIGNA 7.5 PF LTX (GLOVE) ×2 IMPLANT
GOWN STRL REUS W/ TWL LRG LVL3 (GOWN DISPOSABLE) ×2 IMPLANT
GOWN STRL REUS W/ TWL XL LVL3 (GOWN DISPOSABLE) ×1 IMPLANT
GOWN STRL REUS W/TWL LRG LVL3 (GOWN DISPOSABLE) ×2
GOWN STRL REUS W/TWL XL LVL3 (GOWN DISPOSABLE) ×1
KIT BASIN OR (CUSTOM PROCEDURE TRAY) ×2 IMPLANT
KIT TURNOVER KIT B (KITS) ×2 IMPLANT
NS IRRIG 1000ML POUR BTL (IV SOLUTION) ×2 IMPLANT
PAD ARMBOARD 7.5X6 YLW CONV (MISCELLANEOUS) ×4 IMPLANT
POUCH SPECIMEN RETRIEVAL 10MM (ENDOMECHANICALS) ×2 IMPLANT
RELOAD 45 VASCULAR/THIN (ENDOMECHANICALS) IMPLANT
RELOAD STAPLE TA45 3.5 REG BLU (ENDOMECHANICALS) ×2 IMPLANT
SET IRRIG TUBING LAPAROSCOPIC (IRRIGATION / IRRIGATOR) ×2 IMPLANT
SET TUBE SMOKE EVAC HIGH FLOW (TUBING) ×2 IMPLANT
SHEARS HARMONIC ACE PLUS 36CM (ENDOMECHANICALS) ×2 IMPLANT
SLEEVE ENDOPATH XCEL 5M (ENDOMECHANICALS) ×2 IMPLANT
SPECIMEN JAR SMALL (MISCELLANEOUS) ×2 IMPLANT
SUT MON AB 4-0 PC3 18 (SUTURE) ×2 IMPLANT
TOWEL GREEN STERILE (TOWEL DISPOSABLE) ×2 IMPLANT
TOWEL GREEN STERILE FF (TOWEL DISPOSABLE) ×2 IMPLANT
TRAY LAPAROSCOPIC MC (CUSTOM PROCEDURE TRAY) ×2 IMPLANT
TROCAR XCEL BLUNT TIP 100MML (ENDOMECHANICALS) ×2 IMPLANT
TROCAR XCEL NON-BLD 5MMX100MML (ENDOMECHANICALS) ×2 IMPLANT
WATER STERILE IRR 1000ML POUR (IV SOLUTION) ×2 IMPLANT

## 2020-04-03 NOTE — ED Notes (Signed)
Patient has a urine culture in the main lab 

## 2020-04-03 NOTE — ED Triage Notes (Signed)
Pt presents with c/o vomiting since Tuesday after eating lamb chops. Pt reports abdominal pain with the vomiting as well, reports hx of stomach ulcers.

## 2020-04-03 NOTE — ED Notes (Signed)
Phone report given to RN for transport to OR. All questions answered.

## 2020-04-03 NOTE — Op Note (Signed)
Appendectomy, Lap, Procedure Note  Indications: The patient presented with a history of right-sided abdominal pain. A CT revealed findings consistent with acute appendicitis.  Pre-operative Diagnosis: acute appendicitis  Post-operative Diagnosis: Same  Surgeon: Abigail Miyamoto   Assistants: 0  Anesthesia: General endotracheal anesthesia  ASA Class: 2  Procedure Details  The patient was seen again in the Holding Room. The risks, benefits, complications, treatment options, and expected outcomes were discussed with the patient and/or family. The possibilities of reaction to medication, perforation of viscus, bleeding, recurrent infection, finding a normal appendix, the need for additional procedures, failure to diagnose a condition, and creating a complication requiring transfusion or operation were discussed. There was concurrence with the proposed plan and informed consent was obtained. The site of surgery was properly noted. The patient was taken to Operating Room, identified as Rebecca Fields and the procedure verified as Appendectomy. A Time Out was held and the above information confirmed.  The patient was placed in the supine position and general anesthesia was induced, along with placement of orogastric tube, Venodyne boots, and a Foley catheter. The abdomen was prepped and draped in a sterile fashion. A one centimeter infraumbilical incision was made.  The midline fascia was incised with a #15 blade.  A Kelly clamp was used to confirm entrance into the peritoneal cavity.  A pursestring suture was passed around the incision with a 0 Vicryl.  The Hasson was introduced into the abdomen and the tails of the suture were used to hold the Hasson in place.   The pneumoperitoneum was then established to steady pressure of 15 mmHg.  Additional 5 mm cannulas then placed in the left lower quadrant of the abdomen and the right upper quadrant under direct visualization. A careful evaluation of the  entire abdomen was carried out. The patient was placed in Trendelenburg and left lateral decubitus position. The small intestines were retracted in the cephalad and left lateral direction away from the pelvis and right lower quadrant. The patient was found to have an enlarged and inflamed appendix that was extending into the pelvis. There was no evidence of perforation.  The appendix was carefully dissected. The appendix was was skeletonized with the harmonic scalpel.   The appendix was divided at its base using an endo-GIA stapler. Minimal appendiceal stump was left in place. There was no evidence of bleeding, leakage, or complication after division of the appendix. Irrigation was also performed and irrigate suctioned from the abdomen as well.  The umbilical port site was closed with the purse string suture. There was no residual palpable fascial defect.  The trocar site skin wounds were closed with 4-0 Monocryl.  Instrument, sponge, and needle counts were correct at the conclusion of the case.   Findings: The appendix was found to be inflamed. There were not signs of necrosis.  There was not perforation. There was not abscess formation.  Estimated Blood Loss:  Minimal         Complications:  None; patient tolerated the procedure well.         Disposition: PACU - hemodynamically stable.         Condition: stable

## 2020-04-03 NOTE — ED Notes (Addendum)
Pt presents from [facility] ER with appendictis. No perforation or abscess. Pt rec'd 4mg  Morphone last at 1330. Pain 9/10. Rec'd Zofran at [facility] too for nausea. Resp even and unlabored.

## 2020-04-03 NOTE — ED Notes (Signed)
Patient A/A/A, talkative.

## 2020-04-03 NOTE — Transfer of Care (Signed)
Immediate Anesthesia Transfer of Care Note  Patient: Rebecca Fields  Procedure(s) Performed: APPENDECTOMY LAPAROSCOPIC (N/A Abdomen)  Patient Location: PACU  Anesthesia Type:General  Level of Consciousness: awake, alert  and oriented  Airway & Oxygen Therapy: Patient Spontanous Breathing  Post-op Assessment: Report given to RN, Post -op Vital signs reviewed and stable and Patient moving all extremities  Post vital signs: Reviewed and stable  Last Vitals:  Vitals Value Taken Time  BP 143/98   Temp    Pulse 96   Resp 15   SpO2 100     Last Pain:  Vitals:   04/03/20 1409  TempSrc: Oral  PainSc:          Complications: No complications documented.

## 2020-04-03 NOTE — ED Notes (Signed)
ED Provider at bedside. 

## 2020-04-03 NOTE — Anesthesia Procedure Notes (Addendum)
Procedure Name: Intubation Date/Time: 04/03/2020 4:02 PM Performed by: Rande Brunt, CRNA Pre-anesthesia Checklist: Patient identified, Emergency Drugs available, Suction available and Patient being monitored Patient Re-evaluated:Patient Re-evaluated prior to induction Oxygen Delivery Method: Circle System Utilized Preoxygenation: Pre-oxygenation with 100% oxygen Induction Type: IV induction, Cricoid Pressure applied and Rapid sequence Ventilation: Mask ventilation without difficulty Laryngoscope Size: Mac and 4 Grade View: Grade I Tube type: Oral Tube size: 7.0 mm Number of attempts: 1 Airway Equipment and Method: Stylet and Oral airway Placement Confirmation: ETT inserted through vocal cords under direct vision,  positive ETCO2 and breath sounds checked- equal and bilateral Secured at: 22 cm Tube secured with: Tape Dental Injury: Teeth and Oropharynx as per pre-operative assessment

## 2020-04-03 NOTE — ED Provider Notes (Signed)
2:17 PM patient presents from [facility] ER with acute appendicitis.  Surgery APP Maczis PA-C at bedside.   Patient received pain medication, 1 g Rocephin prior to arrival.  She continues to complain of pain.  Additional 500 mg metronidazole ordered.  BP 124/71 (BP Location: Right Arm)   Pulse 88   Temp 98.8 F (37.1 C) (Oral)   Resp 17   Ht 5\' 6"  (1.676 m)   Wt 107.5 kg   LMP 03/20/2020 (Approximate)   SpO2 100%   BMI 38.25 kg/m     03/22/2020, PA-C 04/03/20 1418    06/03/20, MD 04/04/20 2228

## 2020-04-03 NOTE — ED Notes (Signed)
No answer from pt when called for room x 1.  

## 2020-04-03 NOTE — Anesthesia Postprocedure Evaluation (Signed)
Anesthesia Post Note  Patient: Rebecca Fields  Procedure(s) Performed: APPENDECTOMY LAPAROSCOPIC (N/A Abdomen)     Patient location during evaluation: PACU Anesthesia Type: General Level of consciousness: awake Pain management: pain level controlled Vital Signs Assessment: post-procedure vital signs reviewed and stable Respiratory status: spontaneous breathing, nonlabored ventilation, respiratory function stable and patient connected to nasal cannula oxygen Cardiovascular status: blood pressure returned to baseline and stable Postop Assessment: no apparent nausea or vomiting Anesthetic complications: no   No complications documented.  Last Vitals:  Vitals:   04/03/20 1730 04/03/20 1945  BP: 137/79 133/81  Pulse: 68 72  Resp: 15 16  Temp: 36.7 C 36.7 C  SpO2: 100% 100%    Last Pain:  Vitals:   04/03/20 1810  TempSrc:   PainSc: 8                  Alexanderjames Berg P Xzavien Harada

## 2020-04-03 NOTE — ED Notes (Signed)
Screeners report that pt left the ER.

## 2020-04-03 NOTE — Anesthesia Preprocedure Evaluation (Addendum)
Anesthesia Evaluation  Patient identified by MRN, date of birth, ID band Patient awake    Reviewed: Allergy & Precautions, NPO status , Patient's Chart, lab work & pertinent test results  Airway Mallampati: II  TM Distance: >3 FB Neck ROM: Full    Dental  (+) Missing, Chipped   Pulmonary Current Smoker and Patient abstained from smoking.,    Pulmonary exam normal breath sounds clear to auscultation       Cardiovascular negative cardio ROS Normal cardiovascular exam Rhythm:Regular Rate:Normal     Neuro/Psych negative neurological ROS  negative psych ROS   GI/Hepatic Neg liver ROS, PUD,   Endo/Other  negative endocrine ROS  Renal/GU negative Renal ROS     Musculoskeletal negative musculoskeletal ROS (+)   Abdominal (+) + obese,   Peds  Hematology negative hematology ROS (+)   Anesthesia Other Findings Acute Appendicitis  Reproductive/Obstetrics hcg negative                            Anesthesia Physical Anesthesia Plan  ASA: II and emergent  Anesthesia Plan: General   Post-op Pain Management:    Induction: Intravenous and Rapid sequence  PONV Risk Score and Plan: 2 and Ondansetron, Dexamethasone, Midazolam, Scopolamine patch - Pre-op and Treatment may vary due to age or medical condition  Airway Management Planned: Oral ETT  Additional Equipment:   Intra-op Plan:   Post-operative Plan: Extubation in OR  Informed Consent: I have reviewed the patients History and Physical, chart, labs and discussed the procedure including the risks, benefits and alternatives for the proposed anesthesia with the patient or authorized representative who has indicated his/her understanding and acceptance.     Dental advisory given  Plan Discussed with: CRNA  Anesthesia Plan Comments:       Anesthesia Quick Evaluation

## 2020-04-03 NOTE — ED Provider Notes (Addendum)
[facility] EMERGENCY DEPT Provider Note   CSN: [id number] Arrival date & time: 04/03/20  1045     History Chief Complaint  Patient presents with  . Abdominal Pain    Timberlyn Pickford is a 50 y.o. female.  The history is provided by the patient.  Abdominal Pain Pain location:  LLQ and RUQ Pain quality: aching, gnawing, shooting and throbbing   Pain radiates to:  Does not radiate Pain severity:  Severe Onset quality:  Gradual Duration:  3 days Timing:  Constant Progression:  Worsening Chronicity:  Recurrent Context: awakening from sleep   Context: not alcohol use, not recent travel, not sick contacts and not suspicious food intake   Relieved by:  Nothing Worsened by:  Vomiting and eating Ineffective treatments:  NSAIDs and OTC medications Associated symptoms: anorexia, diarrhea, nausea and vomiting   Associated symptoms: no cough, no dysuria, no fever, no hematuria, no shortness of breath, no vaginal bleeding and no vaginal discharge   Associated symptoms comment:  1 small loose stool today only Risk factors: no alcohol abuse   Risk factors comment:  No frequent NSAID use but did take 1 last night hoping it would help with the pain.  Prior history of gastric ulcer but not on PPIs regularly      Past Medical History:  Diagnosis Date  . Gastric ulcer     There are no problems to display for this patient.   Past Surgical History:  Procedure Laterality Date  . FINGER SURGERY       OB History   No obstetric history on file.     History reviewed. No pertinent family history.  Social History   Tobacco Use  . Smoking status: Current Every Day Smoker    Packs/day: 0.50  . Smokeless tobacco: Current User  Substance Use Topics  . Alcohol use: Not Currently  . Drug use: Yes    Types: Marijuana    Comment: occasionally    Home Medications Prior to Admission medications   Medication Sig Start Date End Date Taking? Authorizing Provider   Ascorbic Acid (VITAMIN C PO) Take 1 tablet by mouth daily as needed (For immune system.).    [provider]  diclofenac Sodium (VOLTAREN) 1 % GEL Apply 4 g topically 4 (four) times daily. 03/21/19   Anders Simmonds, PA-C  hydrOXYzine (ATARAX/VISTARIL) 25 MG tablet Take 1 tablet (25 mg total) by mouth every 6 (six) hours. Patient not taking: No sig reported 03/06/19   Joy, Shawn C, PA-C  ibuprofen (ADVIL) 200 MG tablet Take 800-1,000 mg by mouth every 6 (six) hours as needed for headache or mild pain.     [provider]  Menthol, Topical Analgesic, (BIOFREEZE EX) Apply 1 patch topically daily as needed (For pain.). Biofreeze Patch    [provider]  Menthol, Topical Analgesic, (BIOFREEZE EX) Apply 1 application topically daily as needed (For pain.). Biofreeze Cream    [provider]  methocarbamol (ROBAXIN) 500 MG tablet Take 2 tablets (1,000 mg total) by mouth every 8 (eight) hours as needed for muscle spasms. 03/21/19   Anders Simmonds, PA-C  Multiple Vitamin (MULTIVITAMIN WITH MINERALS) TABS tablet Take 1 tablet by mouth daily.    [provider]    Allergies    Patient has no known allergies.  Review of Systems   Review of Systems  Constitutional: Negative for fever.  Respiratory: Negative for cough and shortness of breath.   Gastrointestinal: Positive for abdominal pain, anorexia,  diarrhea, nausea and vomiting.  Genitourinary: Negative for dysuria, hematuria, vaginal bleeding and vaginal discharge.  All other systems reviewed and are negative.   Physical Exam Updated Vital Signs BP 100/63 (BP Location: Right Arm)   Pulse 85   Temp 98.5 F (36.9 C) (Oral)   Resp 16   Ht 5\' 6"  (1.676 m)   Wt 107.5 kg   LMP 03/20/2020 (Approximate)   SpO2 100%   BMI 38.25 kg/m   Physical Exam Vitals and nursing note reviewed.  Constitutional:      General: She is not in acute distress.    Appearance: She is well-developed.  HENT:     Head:  Normocephalic and atraumatic.     Mouth/Throat:     Mouth: Mucous membranes are dry.  Eyes:     Pupils: Pupils are equal, round, and reactive to light.  Cardiovascular:     Rate and Rhythm: Normal rate and regular rhythm.     Pulses: Normal pulses.     Heart sounds: Normal heart sounds. No murmur heard. No friction rub.  Pulmonary:     Effort: Pulmonary effort is normal.     Breath sounds: Normal breath sounds. No wheezing or rales.  Abdominal:     General: Bowel sounds are normal. There is no distension.     Palpations: Abdomen is soft.     Tenderness: There is abdominal tenderness in the left lower quadrant. There is guarding. There is no right CVA tenderness, left CVA tenderness or rebound.     Comments: Significant pain and guarding in the left lower quadrant.  Mild pain in the right upper quadrant  Musculoskeletal:        General: No tenderness. Normal range of motion.     Right lower leg: No edema.     Left lower leg: No edema.     Comments: No edema  Skin:    General: Skin is warm and dry.     Findings: No rash.  Neurological:     General: No focal deficit present.     Mental Status: She is alert and oriented to person, place, and time. Mental status is at baseline.     Cranial Nerves: No cranial nerve deficit.  Psychiatric:        Mood and Affect: Mood normal.        Behavior: Behavior normal.        Thought Content: Thought content normal.     ED Results / Procedures / Treatments   Labs (all labs ordered are listed, but only abnormal results are displayed) Labs Reviewed - No data to display  EKG None  Radiology CT ABDOMEN PELVIS W CONTRAST  Result Date: 04/03/2020 CLINICAL DATA:  Abdominal pain vomiting question diverticulitis EXAM: CT ABDOMEN AND PELVIS WITH CONTRAST TECHNIQUE: Multidetector CT imaging of the abdomen and pelvis was performed using the standard protocol following bolus administration of intravenous contrast. CONTRAST:  100mL OMNIPAQUE IOHEXOL  300 MG/ML  SOLN COMPARISON:  None. FINDINGS: Lower chest: The visualized heart size within normal limits. No pericardial fluid/thickening. No hiatal hernia. The visualized portions of the lungs are clear. Hepatobiliary: The liver is normal in density without focal abnormality.The main portal vein is patent. No evidence of calcified gallstones, gallbladder wall thickening or biliary dilatation. Pancreas: Unremarkable. No pancreatic ductal dilatation or surrounding inflammatory changes. Spleen: Normal in size without focal abnormality. Adrenals/Urinary Tract: Both adrenal glands appear normal. There is a 1 cm low-density lesion seen in the upper pole of the  right kidney. The left kidney is. The bladder is unremarkable. Stomach/Bowel: The stomach and proximal small bowel are normal in appearance. There appears to be a dilated appendix measuring up to 1.4 cm in transverse dimension with wall thickening and surrounding mild fat stranding changes most notable around the appendiceal tip. There is also adjacent wall thickening and narrowing seen at the distal ileum and terminal ileum adjacent to the appendix with surrounding fat stranding changes. There is surrounding mesenteric edema and scattered small lymph nodes. No loculated fluid collections or free air. The colon is otherwise unremarkable. Vascular/Lymphatic: There are no enlarged mesenteric, retroperitoneal, or pelvic lymph nodes. Scattered mild aortic atherosclerosis is seen. Reproductive: The uterus and adnexa are unremarkable. Other: No evidence of abdominal wall mass or hernia. Musculoskeletal: No acute or significant osseous findings. IMPRESSION: 1. Findings suggestive acute appendicitis. No surrounding loculated fluid collections or free air. 2. Adjacent inflammatory changes around the distal ileum/terminal ileum which is likely from concomitant enteritis. 3.  Aortic Atherosclerosis (ICD10-I70.0). 4. These results were called by telephone at the time of  interpretation on 04/03/2020 at 12:16 pm to provider [facility] , who verbally acknowledged these results. Electronically Signed   By: Jonna Clark M.D.   On: 04/03/2020 12:16    Procedures Procedures   Medications Ordered in ED Medications  pantoprazole (PROTONIX) injection 40 mg (has no administration in time range)  morphine 4 MG/ML injection 4 mg (has no administration in time range)  lactated ringers bolus 1,000 mL (1,000 mLs Intravenous New Bag/Given 04/03/20 1105)  ondansetron (ZOFRAN) injection 4 mg (4 mg Intravenous Given 04/03/20 1106)    ED Course  I have reviewed the triage vital signs and the nursing notes.  Pertinent labs & imaging results that were available during my care of the patient were reviewed by me and considered in my medical decision making (see chart for details).    MDM Rules/Calculators/A&P                          50 year old female presenting today with ongoing vomiting and abdominal pain.  Patient symptoms started late Monday night and have been persistent.  She reports inability to hold anything down despite trying to drink fluids and eat crackers.  She reports the pain is just getting worse.  She denies any urinary symptoms or fever.  Unlikely to be bad food exposure or sick contacts as there is nobody else she knows of with similar symptoms.  Patient does have history of gastric ulcer but does not take PPIs regularly.  She does not drink any alcohol at this time and does not use NSAIDs frequently.  On exam she does have significant left lower quadrant tenderness and concern for possible diverticulitis versus colitis versus gastroenteritis.  Patient had blood work done at [facility] today prior to leaving due to the wait and was found to have a leukocytosis of 17,000, CMP without acute findings, UA that was positive for nitrites and had 6-11 white cells but again patient has no complaint of urinary symptoms.  Lipase and pregnancy test were both  normal.  Patient given IV fluids, Protonix, antiemetics and pain control here.  We will do CT to further evaluate.  12:32 PM CT with findings concerning for acute appendicitis without perforation or abscess.  On reevaluation patient is still having pain and was given another dose of pain medication.  Covid 2-hour test is pending.  Will discuss with general surgery and  patient was given Rocephin.  Patient has been n.p.o. since midnight.  12:45 PM Pt will be going to [facility] ED.  Dr. Magnus Ivan with general surgery will see her when she gets there.   MDM Number of Diagnoses or Management Options   Amount and/or Complexity of Data Reviewed Clinical lab tests: reviewed Tests in the radiology section of CPT: ordered and reviewed Independent visualization of images, tracings, or specimens: yes  Risk of Complications, Morbidity, and/or Mortality Presenting problems: moderate Diagnostic procedures: moderate Management options: moderate  Patient Progress Patient progress: stable   Final Clinical Impression(s) / ED Diagnoses Final diagnoses:  Acute appendicitis with localized peritonitis, without perforation, abscess, or gangrene    Rx / DC Orders ED Discharge Orders    None       Gwyneth Sprout, MD 04/03/20 1233    Gwyneth Sprout, MD 04/03/20 1245

## 2020-04-03 NOTE — ED Triage Notes (Signed)
Pt reports that she started having abd pain that radiates to the rectum with n/v and Pt reports having dry heaves. Pt reports hx of ulcers

## 2020-04-03 NOTE — H&P (Signed)
Rebecca Fields Jun 19, 1970  [id number].    Requesting MD: Dr. Anitra Lauth and Rhea Bleacher, PA-C Chief Complaint/Reason for Consult: Acute Appendicitis  HPI: Rebecca Fields is a 50 y.o. female who presented to [facility] ED with abdominal pain, n/v. Patient reports that she awoke on Monday night with lower abdominal pain that was constant, severe and associated with n/v. Her abdominal pain persisted since onset and became more generalized, worse on the left side. She reports associated chills, continued nausea and loss of appetite. She tried Advil without relief. Denies hx of similar symptoms in the past. She presented to the ED where she was found to have WBC of 17 and CT A/P with Acute Appendicitis without perforation or abscess. We were asked to see.   Patient reports no prior abdominal surgeries. She is not on any daily medication or blood thinners. She denies any drug allergies. She admits to tobacco use. Occasional alcohol use and marijuana use. She is not currently working.    ROS: Review of Systems  Constitutional: Positive for fever.  Respiratory: Negative for cough.   Cardiovascular: Negative for leg swelling.  Gastrointestinal: Positive for abdominal pain, nausea and vomiting. Negative for constipation and diarrhea.  Genitourinary: Positive for frequency and urgency. Negative for dysuria.  Musculoskeletal: Negative for back pain.  Psychiatric/Behavioral: Positive for substance abuse.  All other systems reviewed and are negative.   History reviewed. No pertinent family history.  Past Medical History:  Diagnosis Date  . Gastric ulcer     Past Surgical History:  Procedure Laterality Date  . FINGER SURGERY      Social History:  reports that she has been smoking. She has been smoking about 0.50 packs per day. She uses smokeless tobacco. She reports previous alcohol use. She reports current drug use. Drug: Marijuana. As above.   Allergies: No Known Allergies  (Not  in a hospital admission)    Physical Exam: Blood pressure (!) 148/82, pulse 90, temperature 98.5 F (36.9 C), temperature source Oral, resp. rate 20, height 5\' 6"  (1.676 m), weight 107.5 kg, last menstrual period 03/20/2020, SpO2 100 %. General: pleasant, WD/WN female who is laying in bed in NAD HEENT: head is normocephalic, atraumatic.  Sclera are noninjected.  PERRL.  Ears and nose without any masses or lesions.  Mouth is pink and moist. Dentition fair Heart: regular, rate, and rhythm.  Normal s1,s2. No obvious murmurs, gallops, or rubs noted.  Palpable pedal pulses bilaterally  Lungs: CTAB, no wheezes, rhonchi, or rales noted.  Respiratory effort nonlabored Abd: Soft, ND, generalized tenderness greatest in the LLQ and RLQ. No peritonitis. +BS, no masses, hernias, or organomegaly MS: no BUE/BLE edema, calves soft and nontender Skin: warm and dry with no masses, lesions, or rashes Psych: A&Ox4 with an appropriate affect Neuro: cranial nerves grossly intact, equal strength in BUE/BLE bilaterally, moves all extremities, gait not assessed, normal speech, thought process intact  Results for orders placed or performed during the hospital encounter of 04/03/20 (from the past 48 hour(s))  Resp Panel by RT-PCR (Flu A&B, Covid) Nasopharyngeal Swab     Status: None   Collection Time: 04/03/20 12:45 PM   Specimen: Nasopharyngeal Swab; Nasopharyngeal(NP) swabs in vial transport medium  Result Value Ref Range   SARS Coronavirus 2 by RT PCR NEGATIVE NEGATIVE    Comment: (NOTE) SARS-CoV-2 target nucleic acids are NOT DETECTED.  The SARS-CoV-2 RNA is generally detectable in upper respiratory specimens during the acute phase of infection. The lowest concentration of  SARS-CoV-2 viral copies this assay can detect is 138 copies/mL. A negative result does not preclude SARS-Cov-2 infection and should not be used as the sole basis for treatment or other patient management decisions. A negative result may  occur with  improper specimen collection/handling, submission of specimen other than nasopharyngeal swab, presence of viral mutation(s) within the areas targeted by this assay, and inadequate number of viral copies(<138 copies/mL). A negative result must be combined with clinical observations, patient history, and epidemiological information. The expected result is Negative.  Fact Sheet for Patients:  BloggerCourse.com  Fact Sheet for Healthcare Providers:  SeriousBroker.it  This test is no t yet approved or cleared by the [place] FDA and  has been authorized for detection and/or diagnosis of SARS-CoV-2 by FDA under an Emergency Use Authorization (EUA). This EUA will remain  in effect (meaning this test can be used) for the duration of the COVID-19 declaration under Section 564(b)(1) of the Act, 21 U.S.C.section 360bbb-3(b)(1), unless the authorization is terminated  or revoked sooner.       Influenza A by PCR NEGATIVE NEGATIVE   Influenza B by PCR NEGATIVE NEGATIVE    Comment: (NOTE) The Xpert Xpress SARS-CoV-2/FLU/RSV plus assay is intended as an aid in the diagnosis of influenza from Nasopharyngeal swab specimens and should not be used as a sole basis for treatment. Nasal washings and aspirates are unacceptable for Xpert Xpress SARS-CoV-2/FLU/RSV testing.  Fact Sheet for Patients: BloggerCourse.com  Fact Sheet for Healthcare Providers: SeriousBroker.it  This test is not yet approved or cleared by the [place] FDA and has been authorized for detection and/or diagnosis of SARS-CoV-2 by FDA under an Emergency Use Authorization (EUA). This EUA will remain in effect (meaning this test can be used) for the duration of the COVID-19 declaration under Section 564(b)(1) of the Act, 21 U.S.C. section 360bbb-3(b)(1), unless the authorization is terminated or revoked.      CT ABDOMEN PELVIS W CONTRAST  Result Date: 04/03/2020 CLINICAL DATA:  Abdominal pain vomiting question diverticulitis EXAM: CT ABDOMEN AND PELVIS WITH CONTRAST TECHNIQUE: Multidetector CT imaging of the abdomen and pelvis was performed using the standard protocol following bolus administration of intravenous contrast. CONTRAST:  OMNIPAQUE IOHEXOL 300 MG/ML  SOLN COMPARISON:  None. FINDINGS: Lower chest: The visualized heart size within normal limits. No pericardial fluid/thickening. No hiatal hernia. The visualized portions of the lungs are clear. Hepatobiliary: The liver is normal in density without focal abnormality.The main portal vein is patent. No evidence of calcified gallstones, gallbladder wall thickening or biliary dilatation. Pancreas: Unremarkable. No pancreatic ductal dilatation or surrounding inflammatory changes. Spleen: Normal in size without focal abnormality. Adrenals/Urinary Tract: Both adrenal glands appear normal. There is a 1 cm low-density lesion seen in the upper pole of the right kidney. The left kidney is. The bladder is unremarkable. Stomach/Bowel: The stomach and proximal small bowel are normal in appearance. There appears to be a dilated appendix measuring up to 1.4 cm in transverse dimension with wall thickening and surrounding mild fat stranding changes most notable around the appendiceal tip. There is also adjacent wall thickening and narrowing seen at the distal ileum and terminal ileum adjacent to the appendix with surrounding fat stranding changes. There is surrounding mesenteric edema and scattered small lymph nodes. No loculated fluid collections or free air. The colon is otherwise unremarkable. Vascular/Lymphatic: There are no enlarged mesenteric, retroperitoneal, or pelvic lymph nodes. Scattered mild aortic atherosclerosis is seen. Reproductive: The uterus and adnexa are unremarkable. Other: No evidence of  abdominal wall mass or hernia. Musculoskeletal: No acute or  significant osseous findings. IMPRESSION: 1. Findings suggestive acute appendicitis. No surrounding loculated fluid collections or free air. 2. Adjacent inflammatory changes around the distal ileum/terminal ileum which is likely from concomitant enteritis. 3.  Aortic Atherosclerosis (ICD10-I70.0). 4. These results were called by telephone at the time of interpretation on 04/03/2020 at 12:16 pm to provider [facility] , who verbally acknowledged these results. Electronically Signed   By: Jonna Clark M.D.   On: 04/03/2020 12:16   Anti-infectives (From admission, onward)   Start     Dose/Rate Route Frequency Ordered Stop   04/03/20 1230  cefTRIAXone (ROCEPHIN) 1 g in sodium chloride 0.9 % 100 mL IVPB        1 g 200 mL/hr over 30 Minutes Intravenous  Once 04/03/20 1227 04/03/20 1318      Assessment/Plan Acute Appendicitis - Patient with abdominal pain, tenderness on exam, WBC 17.3 and CT scan findings of acute appendicitis without abscess or perforation. Will plan for OR today for laparoscopic appendectomy. Our team has discussed the procedure and risks of appendectomy. The risks include but are not limited to bleeding, infection, wound problems, anesthesia (MI, CVA, death), injury to intra-abdominal organs, possibility of postoperative ileus. She seems to understand and agrees with the plan. Start IV abx. Admit to outpatient observation. Possible d/c from PACU.   Jacinto Halim, [facility] 04/03/2020, 2:02 PM Please see Amion for pager number during day hours 7:00am-4:30pm

## 2020-04-04 ENCOUNTER — Encounter (HOSPITAL_COMMUNITY): Payer: Self-pay | Admitting: Surgery

## 2020-04-04 MED ORDER — MENTHOL 3 MG MT LOZG: 1.0000 | LOZENGE | OROMUCOSAL | Status: DC | PRN

## 2020-04-04 MED ORDER — ACETAMINOPHEN 500 MG PO TABS
1000.0000 mg | ORAL_TABLET | Freq: Four times a day (QID) | ORAL | Status: AC | PRN
Start: 2020-04-04 — End: ?

## 2020-04-04 MED ORDER — OXYCODONE HCL 5 MG PO TABS: 5.0000 mg | ORAL_TABLET | Freq: Four times a day (QID) | ORAL | 0 refills | Status: DC | PRN

## 2020-04-04 NOTE — Progress Notes (Signed)
Patient discharged to home with instructions. 

## 2020-04-04 NOTE — Discharge Instructions (Signed)
[facility] LAPAROSCOPIC SURGERY: POST OP INSTRUCTIONS Always review your discharge instruction sheet given to you by the facility where your surgery was performed. IF YOU HAVE DISABILITY OR FAMILY LEAVE FORMS, YOU MUST BRING THEM TO THE OFFICE FOR PROCESSING.   DO NOT GIVE THEM TO YOUR DOCTOR.  PAIN CONTROL  1. First take acetaminophen (Tylenol) AND/or ibuprofen (Advil) to control your pain after surgery.  Follow directions on package.  Taking acetaminophen (Tylenol) and/or ibuprofen (Advil) regularly after surgery will help to control your pain and lower the amount of prescription pain medication you may need.  You should not take more than 3,000 mg (3 grams) of acetaminophen (Tylenol) in 24 hours.  You should not take ibuprofen (Advil), aleve, motrin, naprosyn or other NSAIDS if you have a history of stomach ulcers or chronic kidney disease.  2. A prescription for pain medication may be given to you upon discharge.  Take your pain medication as prescribed, if you still have uncontrolled pain after taking acetaminophen (Tylenol) or ibuprofen (Advil). 3. Use ice packs to help control pain. 4. If you need a refill on your pain medication, please contact your pharmacy.  They will contact our office to request authorization. Prescriptions will not be filled after 5pm or on week-ends.  HOME MEDICATIONS 5. Take your usually prescribed medications unless otherwise directed.  DIET 6. You should follow a light diet the first few days after arrival home.  Be sure to include lots of fluids daily. Avoid fatty, fried foods.   CONSTIPATION 7. It is common to experience some constipation after surgery and if you are taking pain medication.  Increasing fluid intake and taking a stool softener (such as Colace) will usually help or prevent this problem from occurring.  A mild laxative (Milk of Magnesia or Miralax) should be taken according to package instructions if there are no bowel  movements after 48 hours.  WOUND/INCISION CARE 8. Most patients will experience some swelling and bruising in the area of the incisions.  Ice packs will help.  Swelling and bruising can take several days to resolve.  9. Unless discharge instructions indicate otherwise, follow guidelines below  a. STERI-STRIPS - you may remove your outer bandages 48 hours after surgery, and you may shower at that time.  You have steri-strips (small skin tapes) in place directly over the incision.  These strips should be left on the skin for 7-10 days.   b. DERMABOND/SKIN GLUE - you may shower in 24 hours.  The glue will flake off over the next 2-3 weeks. 10. Any sutures or staples will be removed at the office during your follow-up visit.  ACTIVITIES 11. You may resume regular (light) daily activities beginning the next day--such as daily self-care, walking, climbing stairs--gradually increasing activities as tolerated.  You may have sexual intercourse when it is comfortable.  Refrain from any heavy lifting or straining until approved by your doctor. a. You may drive when you are no longer taking prescription pain medication, you can comfortably wear a seatbelt, and you can safely maneuver your car and apply brakes.  FOLLOW-UP 12. You should see your doctor in the office for a follow-up appointment approximately 2-3 weeks after your surgery.  You should have been given your post-op/follow-up appointment when your surgery was scheduled.  If you did not receive a post-op/follow-up appointment, make sure that you call for this appointment within a day or two after you arrive home to insure a convenient appointment time.     WHEN TO CALL YOUR DOCTOR: 1. Fever over 101.0 2. Inability to urinate 3. Continued bleeding from incision. 4. Increased pain, redness, or drainage from the incision. 5. Increasing abdominal pain  The clinic staff is available to answer your questions during regular business hours.  Please don't  hesitate to call and ask to speak to one of the nurses for clinical concerns.  If you have a medical emergency, go to the nearest emergency room or call 911.  A surgeon from [facility] is always on call at the hospital. [address], [address], [place], [place]  [address] ? [address] [address], [place], [place]   [address] [phone] ? [phone] ? FAX [phone] Web site: [email or web address]  .........   Managing Your Pain After Surgery Without Opioids    Thank you for participating in our program to help patients manage their pain after surgery without opioids. This is part of our effort to provide you with the best care possible, without exposing you or your family to the risk that opioids pose.  What pain can I expect after surgery? You can expect to have some pain after surgery. This is normal. The pain is typically worse the day after surgery, and quickly begins to get better. Many studies have found that many patients are able to manage their pain after surgery with Over-the-Counter (OTC) medications such as Tylenol and Motrin. If you have a condition that does not allow you to take Tylenol or Motrin, notify your surgical team.  How will I manage my pain? The best strategy for controlling your pain after surgery is around the clock pain control with Tylenol (acetaminophen) and Motrin (ibuprofen or Advil). Alternating these medications with each other allows you to maximize your pain control. In addition to Tylenol and Motrin, you can use heating pads or ice packs on your incisions to help reduce your pain.  How will I alternate your regular strength over-the-counter pain medication? You will take a dose of pain medication every three hours. ; Start by taking 650 mg of Tylenol (2 pills of 325 mg) ; 3 hours later take 600 mg of Motrin (3 pills of 200 mg) ; 3 hours after taking the Motrin take 650 mg of Tylenol ; 3 hours after that take 600 mg of  Motrin.   - 1 -  See example - if your first dose of Tylenol is at 12:00 PM   12:00 PM Tylenol 650 mg (2 pills of 325 mg)  3:00 PM Motrin 600 mg (3 pills of 200 mg)  6:00 PM Tylenol 650 mg (2 pills of 325 mg)  9:00 PM Motrin 600 mg (3 pills of 200 mg)  Continue alternating every 3 hours   We recommend that you follow this schedule around-the-clock for at least 3 days after surgery, or until you feel that it is no longer needed. Use the table on the last page of this handout to keep track of the medications you are taking. Important: Do not take more than 3000mg of Tylenol or 3200mg of Motrin in a 24-hour period. Do not take ibuprofen/Motrin if you have a history of bleeding stomach ulcers, severe kidney disease, &/or actively taking a blood thinner  What if I still have pain? If you have pain that is not controlled with the over-the-counter pain medications (Tylenol and Motrin or Advil) you might have what we call "breakthrough" pain. You will receive a prescription for a small amount of an opioid pain medication such as   Oxycodone, Tramadol, or Tylenol with Codeine. Use these opioid pills in the first 24 hours after surgery if you have breakthrough pain. Do not take more than 1 pill every 4-6 hours.  If you still have uncontrolled pain after using all opioid pills, don't hesitate to call our staff using the number provided. We will help make sure you are managing your pain in the best way possible, and if necessary, we can provide a prescription for additional pain medication.   Day 1    Time  Name of Medication Number of pills taken  Amount of Acetaminophen  Pain Level   Comments  AM PM       AM PM       AM PM       AM PM       AM PM       AM PM       AM PM       AM PM       Total Daily amount of Acetaminophen Do not take more than  3,000 mg per day      Day 2    Time  Name of Medication Number of pills taken  Amount of Acetaminophen  Pain Level   Comments  AM  PM       AM PM       AM PM       AM PM       AM PM       AM PM       AM PM       AM PM       Total Daily amount of Acetaminophen Do not take more than  3,000 mg per day      Day 3    Time  Name of Medication Number of pills taken  Amount of Acetaminophen  Pain Level   Comments  AM PM       AM PM       AM PM       AM PM          AM PM       AM PM       AM PM       AM PM       Total Daily amount of Acetaminophen Do not take more than  3,000 mg per day      Day 4    Time  Name of Medication Number of pills taken  Amount of Acetaminophen  Pain Level   Comments  AM PM       AM PM       AM PM       AM PM       AM PM       AM PM       AM PM       AM PM       Total Daily amount of Acetaminophen Do not take more than  3,000 mg per day      Day 5    Time  Name of Medication Number of pills taken  Amount of Acetaminophen  Pain Level   Comments  AM PM       AM PM       AM PM       AM PM       AM PM       AM PM       AM PM         AM PM       Total Daily amount of Acetaminophen Do not take more than  3,000 mg per day       Day 6    Time  Name of Medication Number of pills taken  Amount of Acetaminophen  Pain Level  Comments  AM PM       AM PM       AM PM       AM PM       AM PM       AM PM       AM PM       AM PM       Total Daily amount of Acetaminophen Do not take more than  3,000 mg per day      Day 7    Time  Name of Medication Number of pills taken  Amount of Acetaminophen  Pain Level   Comments  AM PM       AM PM       AM PM       AM PM       AM PM       AM PM       AM PM       AM PM       Total Daily amount of Acetaminophen Do not take more than  3,000 mg per day        For additional information about how and where to safely dispose of unused opioid medications - https://www.morepowerfulnc.org  Disclaimer: This document contains information and/or instructional materials adapted from [facility]  for the typical patient with your condition. It does not replace medical advice from your health care provider because your experience may differ from that of the typical patient. Talk to your health care provider if you have any questions about this document, your condition or your treatment plan. Adapted from [facility]  

## 2020-04-04 NOTE — Discharge Summary (Signed)
[facility] Discharge Summary   Patient ID: Rebecca Fields MRN: [id number] DOB/AGE: 1970-08-17 50 y.o.  Admit date: 04/03/2020 Discharge date: 04/04/2020  Admitting Diagnosis: Acute appendicitis   Discharge Diagnosis Patient Active Problem List   Diagnosis Date Noted  . Acute appendicitis 04/03/2020    Consultants None   Imaging: CT ABDOMEN PELVIS W CONTRAST  Result Date: 04/03/2020 CLINICAL DATA:  Abdominal pain vomiting question diverticulitis EXAM: CT ABDOMEN AND PELVIS WITH CONTRAST TECHNIQUE: Multidetector CT imaging of the abdomen and pelvis was performed using the standard protocol following bolus administration of intravenous contrast. CONTRAST:  OMNIPAQUE IOHEXOL 300 MG/ML  SOLN COMPARISON:  None. FINDINGS: Lower chest: The visualized heart size within normal limits. No pericardial fluid/thickening. No hiatal hernia. The visualized portions of the lungs are clear. Hepatobiliary: The liver is normal in density without focal abnormality.The main portal vein is patent. No evidence of calcified gallstones, gallbladder wall thickening or biliary dilatation. Pancreas: Unremarkable. No pancreatic ductal dilatation or surrounding inflammatory changes. Spleen: Normal in size without focal abnormality. Adrenals/Urinary Tract: Both adrenal glands appear normal. There is a 1 cm low-density lesion seen in the upper pole of the right kidney. The left kidney is. The bladder is unremarkable. Stomach/Bowel: The stomach and proximal small bowel are normal in appearance. There appears to be a dilated appendix measuring up to 1.4 cm in transverse dimension with wall thickening and surrounding mild fat stranding changes most notable around the appendiceal tip. There is also adjacent wall thickening and narrowing seen at the distal ileum and terminal ileum adjacent to the appendix with surrounding fat stranding changes. There is surrounding mesenteric edema and scattered small lymph  nodes. No loculated fluid collections or free air. The colon is otherwise unremarkable. Vascular/Lymphatic: There are no enlarged mesenteric, retroperitoneal, or pelvic lymph nodes. Scattered mild aortic atherosclerosis is seen. Reproductive: The uterus and adnexa are unremarkable. Other: No evidence of abdominal wall mass or hernia. Musculoskeletal: No acute or significant osseous findings. IMPRESSION: 1. Findings suggestive acute appendicitis. No surrounding loculated fluid collections or free air. 2. Adjacent inflammatory changes around the distal ileum/terminal ileum which is likely from concomitant enteritis. 3.  Aortic Atherosclerosis (ICD10-I70.0). 4. These results were called by telephone at the time of interpretation on 04/03/2020 at 12:16 pm to provider [facility] , who verbally acknowledged these results. Electronically Signed   By: Jonna Clark M.D.   On: 04/03/2020 12:16    Procedures Dr. Abigail Miyamoto (04/03/20) - Laparoscopic Appendectomy  Hospital Course:  Patient is a 50 year old female who presented to [facility] with abdominal pain.  Workup showed acute appendicitis.  Patient was admitted and underwent procedure listed above.  Tolerated procedure well and was transferred to the floor.  Diet was advanced as tolerated.  On POD#1, the patient was voiding well, tolerating diet, ambulating well, pain well controlled, vital signs stable, incisions c/d/i and felt stable for discharge home.  Patient will follow up in our office in 2 weeks and knows to call with questions or concerns. She will call to confirm appointment date/time.    Physical Exam: General:  Alert, NAD, pleasant, comfortable Abd:  Soft, ND, mild tenderness, incisions C/D/I   I or a member of my team have reviewed this patient in the Controlled Substance Database.   Allergies as of 04/04/2020   No Known Allergies     Medication List    TAKE these medications   acetaminophen 500 MG tablet Commonly known as:  TYLENOL Take 2 tablets (  1,000 mg total) by mouth every 6 (six) hours as needed for mild pain or fever.   multivitamin with minerals Tabs tablet Take 1 tablet by mouth daily.   oxyCODONE 5 MG immediate release tablet Commonly known as: Oxy IR/ROXICODONE Take 1 tablet (5 mg total) by mouth every 6 (six) hours as needed for moderate pain or severe pain.         Follow-up Information    [facility].   Specialty: General Surgery Why: We are working on scheduling a follow up appointment to be seen in 3-4 weeks. Please call to confirm appointment date/time.  Contact information: [address] [place] [address] [phone]               Signed: Juliet Rude , [facility] 04/04/2020, 7:59 AM Please see Amion for pager number during day hours 7:00am-4:30pm

## 2020-04-07 LAB — SURGICAL PATHOLOGY

## 2020-04-21 ENCOUNTER — Encounter (HOSPITAL_BASED_OUTPATIENT_CLINIC_OR_DEPARTMENT_OTHER): Payer: Self-pay

## 2020-04-21 ENCOUNTER — Other Ambulatory Visit: Payer: Self-pay

## 2020-04-21 ENCOUNTER — Emergency Department (HOSPITAL_BASED_OUTPATIENT_CLINIC_OR_DEPARTMENT_OTHER)
Admission: EM | Admit: 2020-04-21 | Discharge: 2020-04-21 | Disposition: A | Discharge status: Home / Self Care | Payer: Self-pay | Attending: Emergency Medicine | Admitting: Emergency Medicine

## 2020-04-21 DIAGNOSIS — L02415 Cutaneous abscess of right lower limb: Secondary | ICD-10-CM | POA: Insufficient documentation

## 2020-04-21 DIAGNOSIS — L0291 Cutaneous abscess, unspecified: Secondary | ICD-10-CM

## 2020-04-21 DIAGNOSIS — F17200 Nicotine dependence, unspecified, uncomplicated: Secondary | ICD-10-CM | POA: Insufficient documentation

## 2020-04-21 MED ORDER — CLINDAMYCIN HCL 150 MG PO CAPS: 300.0000 mg | ORAL_CAPSULE | Freq: Three times a day (TID) | ORAL | 0 refills | Status: AC

## 2020-04-21 MED ORDER — LIDOCAINE-EPINEPHRINE (PF) 2 %-1:200000 IJ SOLN
10.0000 mL | Freq: Once | INTRAMUSCULAR | Status: AC
  Administered 2020-04-21: 10 mL
  Filled 2020-04-21: qty 20

## 2020-04-21 MED ORDER — OXYCODONE-ACETAMINOPHEN 5-325 MG PO TABS
1.0000 | ORAL_TABLET | Freq: Once | ORAL | Status: AC
  Administered 2020-04-21: 1 via ORAL
  Filled 2020-04-21: qty 1

## 2020-04-21 MED ORDER — CLINDAMYCIN HCL 150 MG PO CAPS
300.0000 mg | ORAL_CAPSULE | Freq: Once | ORAL | Status: AC
  Administered 2020-04-21: 300 mg via ORAL
  Filled 2020-04-21: qty 2

## 2020-04-21 NOTE — ED Triage Notes (Signed)
Patient her POV from Home with an Abscess.  Abscess is located R. Upper Medial Thigh and has been present and worsening for 3 days.   No drainage. No Fevers. Painful.

## 2020-04-21 NOTE — ED Provider Notes (Signed)
[facility] EMERGENCY DEPT Provider Note   CSN: [id number] Arrival date & time: 04/21/20  2158     History Chief Complaint  Patient presents with  . Abscess    R. Medial Thigh     Rebecca Fields is a 50 y.o. female.  The history is provided by the patient and medical records.  Abscess  Rebecca Fields is a 50 y.o. female who presents to the Emergency Department complaining of boil. She presents the emergency department complaining of boil to the right inner thigh. Three days ago she noticed a small bump in that area that was sore to the touch. Yesterday it significantly increased in size. Today it became very severe and tender. She has been trying hot bath at home with no significant improvement. She has no medical problems and takes no medications. She recently was admitted to the hospital and treated for appendicitis. She states that she has been doing very well postoperatively. No fevers, nausea, vomiting, diarrhea, dysuria. No history of prior abscess.    Past Medical History:  Diagnosis Date  . Gastric ulcer     Patient Active Problem List   Diagnosis Date Noted  . Acute appendicitis 04/03/2020    Past Surgical History:  Procedure Laterality Date  . FINGER SURGERY    . LAPAROSCOPIC APPENDECTOMY N/A 04/03/2020   Procedure: APPENDECTOMY LAPAROSCOPIC;  Surgeon: Abigail Miyamoto, MD;  Location: [facility] OR;  Service: General;  Laterality: N/A;     OB History   No obstetric history on file.     No family history on file.  Social History   Tobacco Use  . Smoking status: Current Every Day Smoker    Packs/day: 0.50  . Smokeless tobacco: Never Used  Vaping Use  . Vaping Use: Never used  Substance Use Topics  . Alcohol use: Yes    Comment: Occasionally   . Drug use: Yes    Types: Marijuana    Comment: occasionally    Home Medications Prior to Admission medications   Medication Sig Start Date End Date Taking? Authorizing Provider  clindamycin  (CLEOCIN) 150 MG capsule Take 2 capsules (300 mg total) by mouth 3 (three) times daily for 5 days. 04/21/20 04/26/20 Yes Tilden Fossa, MD  acetaminophen (TYLENOL) 500 MG tablet Take 2 tablets (1,000 mg total) by mouth every 6 (six) hours as needed for mild pain or fever. 04/04/20   Juliet Rude, PA-C  Multiple Vitamin (MULTIVITAMIN WITH MINERALS) TABS tablet Take 1 tablet by mouth daily.    [provider]  oxyCODONE (OXY IR/ROXICODONE) 5 MG immediate release tablet Take 1 tablet (5 mg total) by mouth every 6 (six) hours as needed for moderate pain or severe pain. 04/04/20   Juliet Rude, PA-C    Allergies    Patient has no known allergies.  Review of Systems   Review of Systems  All other systems reviewed and are negative.   Physical Exam Updated Vital Signs BP (!) 153/119 (BP Location: Right Arm)   Pulse 82   Temp 97.7 F (36.5 C) (Oral)   Resp 20   Ht 5\' 6"  (1.676 m)   Wt 107.5 kg   SpO2 100%   BMI 38.25 kg/m   Physical Exam Vitals and nursing note reviewed.  Constitutional:      Appearance: She is well-developed.  HENT:     Head: Normocephalic and atraumatic.  Cardiovascular:     Rate and Rhythm: Normal rate and regular rhythm.  Pulmonary:  Effort: Pulmonary effort is normal. No respiratory distress.  Musculoskeletal:        General: No tenderness.     Comments: There is a well circumscribed abscess to the right upper inner thigh, approximately 4 x 4 cm in size. There is moderate surrounding erythema and induration.  Skin:    General: Skin is warm and dry.  Neurological:     Mental Status: She is alert and oriented to person, place, and time.  Psychiatric:        Behavior: Behavior normal.     ED Results / Procedures / Treatments   Labs (all labs ordered are listed, but only abnormal results are displayed) Labs Reviewed - No data to display  EKG None  Radiology No results found.  Procedures .Marland KitchenIncision and Drainage  Date/Time:  04/21/2020 10:48 PM Performed by: Tilden Fossa, MD Authorized by: Tilden Fossa, MD   Consent:    Consent obtained:  Verbal   Consent given by:  Patient   Risks, benefits, and alternatives were discussed: yes     Risks discussed:  Bleeding, incomplete drainage and pain Universal protocol:    Patient identity confirmed:  Verbally with patient Location:    Type:  Abscess   Location:  Lower extremity   Lower extremity location:  Leg   Leg location:  R upper leg Pre-procedure details:    Skin preparation:  Chlorhexidine Anesthesia:    Anesthesia method:  Local infiltration   Local anesthetic:  Lidocaine 2% WITH epi Procedure type:    Complexity:  Complex Procedure details:    Incision types:  Single straight   Wound management:  Probed and deloculated and irrigated with saline   Drainage:  Purulent and bloody   Drainage amount:  Copious   Wound treatment:  Wound left open   Packing materials:  None Post-procedure details:    Procedure completion:  Tolerated     Medications Ordered in ED Medications  clindamycin (CLEOCIN) capsule 300 mg (has no administration in time range)  oxyCODONE-acetaminophen (PERCOCET/ROXICET) 5-325 MG per tablet 1 tablet (1 tablet Oral Given 04/21/20 2222)  lidocaine-EPINEPHrine (XYLOCAINE W/EPI) 2 %-1:200000 (PF) injection 10 mL (10 mLs Infiltration Given 04/21/20 2222)    ED Course  I have reviewed the triage vital signs and the nursing notes.  Pertinent labs & imaging results that were available during my care of the patient were reviewed by me and considered in my medical decision making (see chart for details).    MDM Rules/Calculators/A&P                         Pt here for evaluation of swelling to right inner thigh.  Exam c/w abscess.  I and D performed per note.  Large amount of purulent material returned.  Due to posterior extension with erythema and induration concerning for cellulitis as well patient was started on antibiotics.   Discussed wound care, outpatient follow up and return precautions.    Final Clinical Impression(s) / ED Diagnoses Final diagnoses:  Abscess    Rx / DC Orders ED Discharge Orders         Ordered    clindamycin (CLEOCIN) 150 MG capsule  3 times daily        04/21/20 2248           Tilden Fossa, MD 04/21/20 2251

## 2020-07-12 ENCOUNTER — Emergency Department (HOSPITAL_BASED_OUTPATIENT_CLINIC_OR_DEPARTMENT_OTHER): Payer: Self-pay

## 2020-07-12 ENCOUNTER — Emergency Department (HOSPITAL_BASED_OUTPATIENT_CLINIC_OR_DEPARTMENT_OTHER)
Admission: EM | Admit: 2020-07-12 | Discharge: 2020-07-12 | Disposition: A | Discharge status: Home / Self Care | Payer: Self-pay | Attending: Emergency Medicine | Admitting: Emergency Medicine

## 2020-07-12 ENCOUNTER — Encounter (HOSPITAL_BASED_OUTPATIENT_CLINIC_OR_DEPARTMENT_OTHER): Payer: Self-pay

## 2020-07-12 DIAGNOSIS — F17210 Nicotine dependence, cigarettes, uncomplicated: Secondary | ICD-10-CM | POA: Insufficient documentation

## 2020-07-12 DIAGNOSIS — Z20822 Contact with and (suspected) exposure to covid-19: Secondary | ICD-10-CM | POA: Insufficient documentation

## 2020-07-12 DIAGNOSIS — Z8616 Personal history of COVID-19: Secondary | ICD-10-CM | POA: Insufficient documentation

## 2020-07-12 DIAGNOSIS — J019 Acute sinusitis, unspecified: Secondary | ICD-10-CM | POA: Insufficient documentation

## 2020-07-12 MED ORDER — FLUTICASONE PROPIONATE 50 MCG/ACT NA SUSP: 1.0000 | Freq: Every day | NASAL | 1 refills | Status: AC

## 2020-07-12 MED ORDER — LORATADINE 10 MG PO TABS: 10.0000 mg | ORAL_TABLET | Freq: Every day | ORAL | 0 refills | Status: AC

## 2020-07-12 MED ORDER — PSEUDOEPHEDRINE HCL ER 120 MG PO TB12: 120.0000 mg | ORAL_TABLET | Freq: Two times a day (BID) | ORAL | 0 refills | Status: DC

## 2020-07-12 NOTE — Discharge Instructions (Addendum)
Take the steroids, decongestant and antihistamine as prescribed.  You can also take over-the-counter Tylenol or Advil as needed for pain.  Follow-up with a primary care doctor if the symptoms have not improved in a week

## 2020-07-12 NOTE — ED Provider Notes (Signed)
[facility] EMERGENCY DEPT Provider Note   CSN: [id number] Arrival date & time: 07/12/20  7106     History Chief Complaint  Patient presents with   Facial Pain    Rebecca Fields is a 50 y.o. female.  HPI  Patient presents to the ED with complaints of sinus pressure and congestion.  Patient states the symptoms started yesterday.  She has tried taking some ibuprofen without relief.  She feels sinus pain and burning bilaterally.  She feels that her nose is congested and it is hard for her to breathe.  She is not having any chest pain.  No fevers.  Patient did have COVID last year.  She has taken 2 home negative tests so far since this started.  Past Medical History:  Diagnosis Date   Gastric ulcer     Patient Active Problem List   Diagnosis Date Noted   Acute appendicitis 04/03/2020    Past Surgical History:  Procedure Laterality Date   FINGER SURGERY     LAPAROSCOPIC APPENDECTOMY N/A 04/03/2020   Procedure: APPENDECTOMY LAPAROSCOPIC;  Surgeon: Abigail Miyamoto, MD;  Location: [facility] OR;  Service: General;  Laterality: N/A;     OB History   No obstetric history on file.     No family history on file.  Social History   Tobacco Use   Smoking status: Every Day    Packs/day: 0.50    Pack years: 0.00    Types: Cigarettes   Smokeless tobacco: Never  Vaping Use   Vaping Use: Never used  Substance Use Topics   Alcohol use: Yes    Comment: Occasionally    Drug use: Yes    Types: Marijuana    Comment: occasionally    Home Medications Prior to Admission medications   Medication Sig Start Date End Date Taking? Authorizing Provider  fluticasone (FLONASE) 50 MCG/ACT nasal spray Place 1 spray into both nostrils daily. 07/12/20  Yes Linwood Dibbles, MD  loratadine (CLARITIN) 10 MG tablet Take 1 tablet (10 mg total) by mouth daily for 14 days. 07/12/20 07/26/20 Yes Linwood Dibbles, MD  pseudoephedrine (SUDAFED 12 HOUR) 120 MG 12 hr tablet Take 1 tablet (120 mg total) by  mouth every 12 (twelve) hours. 07/12/20  Yes Linwood Dibbles, MD  acetaminophen (TYLENOL) 500 MG tablet Take 2 tablets (1,000 mg total) by mouth every 6 (six) hours as needed for mild pain or fever. 04/04/20   Juliet Rude, PA-C  Multiple Vitamin (MULTIVITAMIN WITH MINERALS) TABS tablet Take 1 tablet by mouth daily.    [provider]  oxyCODONE (OXY IR/ROXICODONE) 5 MG immediate release tablet Take 1 tablet (5 mg total) by mouth every 6 (six) hours as needed for moderate pain or severe pain. 04/04/20   Juliet Rude, PA-C    Allergies    Patient has no known allergies.  Review of Systems   Review of Systems  All other systems reviewed and are negative.  Physical Exam Updated Vital Signs BP (!) 142/72 (BP Location: Right Arm)   Pulse 81   Temp 98.3 F (36.8 C) (Oral)   Resp 16   Ht 1.676 m (5\' 6" )   Wt 104.3 kg   LMP 07/02/2020 (Exact Date)   SpO2 100%   BMI 37.12 kg/m   Physical Exam Vitals and nursing note reviewed.  Constitutional:      General: She is not in acute distress.    Appearance: She is well-developed.  HENT:     Head: Normocephalic and  atraumatic.     Right Ear: Ear canal and external ear normal.     Left Ear: Tympanic membrane and external ear normal.     Mouth/Throat:     Mouth: Mucous membranes are moist.     Pharynx: No posterior oropharyngeal erythema.  Eyes:     General: No scleral icterus.       Right eye: No discharge.        Left eye: No discharge.     Conjunctiva/sclera: Conjunctivae normal.  Neck:     Trachea: No tracheal deviation.  Cardiovascular:     Rate and Rhythm: Normal rate and regular rhythm.  Pulmonary:     Effort: Pulmonary effort is normal. No respiratory distress.     Breath sounds: Normal breath sounds. No stridor. No wheezing or rales.  Abdominal:     General: Bowel sounds are normal. There is no distension.     Palpations: Abdomen is soft.     Tenderness: There is no abdominal tenderness. There is no guarding or  rebound.  Musculoskeletal:        General: No tenderness or deformity.     Cervical back: Neck supple.  Skin:    General: Skin is warm and dry.     Findings: No rash.  Neurological:     General: No focal deficit present.     Mental Status: She is alert.     Cranial Nerves: No cranial nerve deficit (no facial droop, extraocular movements intact, no slurred speech).     Sensory: No sensory deficit.     Motor: No abnormal muscle tone or seizure activity.     Coordination: Coordination normal.  Psychiatric:        Mood and Affect: Mood normal.    ED Results / Procedures / Treatments   Labs (all labs ordered are listed, but only abnormal results are displayed) Labs Reviewed  SARS CORONAVIRUS 2 (TAT 6-24 HRS)    EKG None  Radiology DG Chest Portable 1 View  Result Date: 07/12/2020 CLINICAL DATA:  Cough. Additional provided: Patient reports facial (sinus) pain and pressure since yesterday. EXAM: PORTABLE CHEST 1 VIEW COMPARISON:  No pertinent prior exams available for comparison. FINDINGS: Heart size within normal limits. No appreciable airspace consolidation. No evidence of pleural effusion or pneumothorax. No acute bony abnormality identified. IMPRESSION: No evidence of active cardiopulmonary disease. Electronically Signed   By: Jackey Loge DO   On: 07/12/2020 10:14    Procedures Procedures   Medications Ordered in ED Medications - No data to display  ED Course  I have reviewed the triage vital signs and the nursing notes.  Pertinent labs & imaging results that were available during my care of the patient were reviewed by me and considered in my medical decision making (see chart for details).  Clinical Course as of 07/12/20 1119  Sat Jul 12, 2020  1102 Chest x-ray without signs of pneumonia [JK]  1116 Chest x-ray does not show pneumonia [JK]    Clinical Course User Index [JK] Linwood Dibbles, MD   MDM Rules/Calculators/A&P                         No signs of pneumonia on  x-ray.  Doubt COVID as patient has had 2 home negative tests but we will send off a confirmatory PCR test.  Suspect sinusitis.  Most likely viral or allergic.  Doubt bacterial.  Will prescribe nasal steroids, decongestants as well as antihistamines. Final  Clinical Impression(s) / ED Diagnoses Final diagnoses:  Acute sinusitis, recurrence not specified, unspecified location    Rx / DC Orders ED Discharge Orders          Ordered    fluticasone (FLONASE) 50 MCG/ACT nasal spray  Daily        07/12/20 1118    pseudoephedrine (SUDAFED 12 HOUR) 120 MG 12 hr tablet  Every 12 hours        07/12/20 1118    loratadine (CLARITIN) 10 MG tablet  Daily        07/12/20 1118             Linwood Dibbles, MD 07/12/20 1119

## 2020-07-12 NOTE — ED Triage Notes (Signed)
She c/o facial (sinus) pain and pressure since yesterday. She denies fever/cough and is in no distress.

## 2020-07-13 LAB — SARS CORONAVIRUS 2 (TAT 6-24 HRS): SARS Coronavirus 2: NEGATIVE

## 2020-09-24 ENCOUNTER — Encounter (INDEPENDENT_AMBULATORY_CARE_PROVIDER_SITE_OTHER): Payer: Self-pay

## 2020-09-24 ENCOUNTER — Ambulatory Visit (INDEPENDENT_AMBULATORY_CARE_PROVIDER_SITE_OTHER): Payer: Self-pay | Admitting: Primary Care

## 2020-09-24 ENCOUNTER — Encounter (INDEPENDENT_AMBULATORY_CARE_PROVIDER_SITE_OTHER): Payer: Self-pay | Admitting: Primary Care

## 2020-09-24 ENCOUNTER — Other Ambulatory Visit: Payer: Self-pay

## 2020-09-24 ENCOUNTER — Ambulatory Visit: Payer: Self-pay

## 2020-09-24 VITALS — BP 125/84 | HR 79 | Temp 97.5°F | Ht 66.0 in | Wt 232.6 lb

## 2020-09-24 DIAGNOSIS — Z7689 Persons encountering health services in other specified circumstances: Secondary | ICD-10-CM

## 2020-09-24 DIAGNOSIS — Z6837 Body mass index (BMI) 37.0-37.9, adult: Secondary | ICD-10-CM

## 2020-09-24 DIAGNOSIS — F17210 Nicotine dependence, cigarettes, uncomplicated: Secondary | ICD-10-CM

## 2020-09-24 DIAGNOSIS — K921 Melena: Secondary | ICD-10-CM

## 2020-09-24 DIAGNOSIS — Z9049 Acquired absence of other specified parts of digestive tract: Secondary | ICD-10-CM

## 2020-09-24 DIAGNOSIS — M79604 Pain in right leg: Secondary | ICD-10-CM

## 2020-09-24 DIAGNOSIS — Z720 Tobacco use: Secondary | ICD-10-CM

## 2020-09-24 DIAGNOSIS — M238X2 Other internal derangements of left knee: Secondary | ICD-10-CM

## 2020-09-24 DIAGNOSIS — F411 Generalized anxiety disorder: Secondary | ICD-10-CM

## 2020-09-24 DIAGNOSIS — E6609 Other obesity due to excess calories: Secondary | ICD-10-CM

## 2020-09-24 MED ORDER — NAPROXEN 500 MG PO TABS: 500.0000 mg | ORAL_TABLET | Freq: Two times a day (BID) | ORAL | 1 refills | Status: DC

## 2020-09-24 NOTE — Progress Notes (Signed)
New Patient Office Visit  Subjective:  Patient ID: Rebecca Fields, female    DOB: 1971-01-23  Age: 50 y.o. MRN: [id number]  CC:  Chief Complaint  Patient presents with   New Patient (Initial Visit)    Pain in left leg. Pain begins at ankle radiates up. Burns and aches.    HPI Rebecca Fields is a 50 year old obese female who presents for establishment of care . She voices concerns left foot starts aching and burning radiates up to her knee. She presents with compression hose on left leg only due to swelling. Denies shortness of breath, headaches, or chest pain . Past Medical History:  Diagnosis Date   Gastric ulcer     Past Surgical History:  Procedure Laterality Date   FINGER SURGERY     LAPAROSCOPIC APPENDECTOMY N/A 04/03/2020   Procedure: APPENDECTOMY LAPAROSCOPIC;  Surgeon: Abigail Miyamoto, MD;  Location: [facility] OR;  Service: General;  Laterality: N/A;    History reviewed. No pertinent family history.  Social History   Socioeconomic History   Marital status: Single    Spouse name: Not on file   Number of children: Not on file   Years of education: Not on file   Highest education level: Not on file  Occupational History   Not on file  Tobacco Use   Smoking status: Every Day    Packs/day: 0.50    Types: Cigarettes   Smokeless tobacco: Never  Vaping Use   Vaping Use: Never used  Substance and Sexual Activity   Alcohol use: Yes    Comment: Occasionally    Drug use: Yes    Types: Marijuana    Comment: occasionally   Sexual activity: Yes    Birth control/protection: None  Other Topics Concern   Not on file  Social History Narrative   Not on file   Social Determinants of Health   Financial Resource Strain: Not on file  Food Insecurity: Not on file  Transportation Needs: Not on file  Physical Activity: Not on file  Stress: Not on file  Social Connections: Not on file  Intimate Partner Violence: Not on file    ROS Review of Systems   Cardiovascular:  Positive for leg swelling.  Endocrine: Positive for polydipsia.  Genitourinary:  Positive for frequency.       With incontinence urinary   Musculoskeletal:        Knee and foot pain/ burning and tinglig  Neurological:  Positive for weakness and numbness.  Psychiatric/Behavioral:  The patient is nervous/anxious.   All other systems reviewed and are negative.  Objective:   Today's Vitals: BP 125/84 (BP Location: Right Arm, Patient Position: Sitting, Cuff Size: Large)   Pulse 79   Temp (!) 97.5 F (36.4 C) (Temporal)   Ht 5\' 6"  (1.676 m)   Wt 232 lb 9.6 oz (105.5 kg)   LMP 09/19/2020 (Exact Date)   SpO2 99%   BMI 37.54 kg/m   Physical Exam General: Vital signs reviewed.  Patient is well-developed and well-nourished, obese female in no acute distress and cooperative with exam.  Head: Normocephalic and atraumatic. Eyes: EOMI, conjunctivae normal, no scleral icterus.  Neck: Supple, trachea midline, normal ROM, no JVD, masses, thyromegaly, or carotid bruit present.  Cardiovascular: RRR, S1 normal, S2 normal, no murmurs, gallops, or rubs. Pulmonary/Chest: Clear to auscultation bilaterally, no wheezes, rales, or rhonchi. Abdominal: Soft, non-tender, non-distended, BS +, no masses, organomegaly, or guarding present.  Musculoskeletal: No joint deformities, erythema, or stiffness,  ROM full and nontender. Extremities: No lower extremity edema bilaterally,  pulses symmetric and intact bilaterally. No cyanosis or clubbing. Neurological: A&O x3, Strength is normal and symmetric bilaterally,  no focal motor deficit, sensory intact to light touch bilaterally.  Skin: Warm, dry and intact. No rashes or erythema. Psychiatric: Normal mood and affect. speech and behavior is normal. Cognition and memory are normal.   Assessment & Plan:  [name] was seen today for new patient (initial visit).  Diagnoses and all orders for this visit:  Encounter to establish care Establish care with  PCP  Leg pain, diffuse, right -     naproxen (NAPROSYN) 500 MG tablet; Take 1 tablet (500 mg total) by mouth 2 (two) times daily with a meal.  Class 2 obesity due to excess calories without serious comorbidity with body mass index (BMI) of 37.0 to 37.9 in adult Obesity is 30-39 indicating an excess in caloric intake or underlining conditions. This may lead to other co-morbidities. Lifestyle modifications of diet and exercise may reduce obesity.    History of appendectomy Waxing and waning abdominal pain   Hematochezia -     Fecal occult blood, imunochemical; Future  Crepitus of joint of left knee -     naproxen (NAPROSYN) 500 MG tablet; Take 1 tablet (500 mg total) by mouth 2 (two) times daily with a meal.  Tobacco abuse - I have recommended complete cessation of tobacco use. I have discussed various options available for assistance with tobacco cessation including over the counter methods (Nicotine gum, patch and lozenges). We also discussed prescription options (Chantix, Nicotine Inhaler / Nasal Spray). The patient is not interested in pursuing any prescription tobacco cessation options at this time. - Patient declines at this time.  - Less than 5 minutes spent on counseling.   Generalized anxiety disorder  Refer to [facility] Outpatient Encounter Medications as of 09/24/2020  Medication Sig   naproxen (NAPROSYN) 500 MG tablet Take 1 tablet (500 mg total) by mouth 2 (two) times daily with a meal.   acetaminophen (TYLENOL) 500 MG tablet Take 2 tablets (1,000 mg total) by mouth every 6 (six) hours as needed for mild pain or fever.   fluticasone (FLONASE) 50 MCG/ACT nasal spray Place 1 spray into both nostrils daily.   loratadine (CLARITIN) 10 MG tablet Take 1 tablet (10 mg total) by mouth daily for 14 days.   Multiple Vitamin (MULTIVITAMIN WITH MINERALS) TABS tablet Take 1 tablet by mouth daily.   [DISCONTINUED] oxyCODONE (OXY IR/ROXICODONE) 5 MG immediate release tablet Take 1 tablet (5 mg  total) by mouth every 6 (six) hours as needed for moderate pain or severe pain.   [DISCONTINUED] pseudoephedrine (SUDAFED 12 HOUR) 120 MG 12 hr tablet Take 1 tablet (120 mg total) by mouth every 12 (twelve) hours.   No facility-administered encounter medications on file as of 09/24/2020.     Flowsheet Row Office Visit from 03/21/2019 in [facility]  PHQ-9 Total Score 2      Follow-up: Return for schedule appt after seen by patient assisstance .   Grayce Sessions, NP

## 2020-09-24 NOTE — Telephone Encounter (Signed)
Patient called and says she's on the way to the office for her 1350 appointment, but the agent told her it would be too late if she arrives after 10 minutes. She says the GPS is saying for her to get there at 1415. She says she really needs to see someone today. Patient had a new patient appointment scheduled. I called the office and spoke to [facility] [employer] about the patient's appointment. She says to let the patient know that if she's more than 15 minutes late, she will need to reschedule, says the patient may come and sit in the office and if there is a cancellation, she would be seen. I advised the patient of the above, she says she should be there by 1405. She says she has left leg pain in the front of the leg that has been ongoing since last year, pain is a 8-9. She says she is also passing blood in her stool, not every stool, but blood is on the tissue each time. No injury to the leg that she's aware of. Care advice given, patient verbalized understanding.    Reason for Disposition  [1] MODERATE pain (e.g., interferes with normal activities, limping) AND [2] present > 3 days  Answer Assessment - Initial Assessment Questions 1. ONSET: "When did the pain start?"      Last year 2. LOCATION: "Where is the pain located?"      Left leg in the front 3. PAIN: "How bad is the pain?"    (Scale 1-10; or mild, moderate, severe)   -  MILD (1-3): doesn't interfere with normal activities    -  MODERATE (4-7): interferes with normal activities (e.g., work or school) or awakens from sleep, limping    -  SEVERE (8-10): excruciating pain, unable to do any normal activities, unable to walk     8-9 4. WORK OR EXERCISE: "Has there been any recent work or exercise that involved this part of the body?"      No 5. CAUSE: "What do you think is causing the leg pain?"     I don't know 6. OTHER SYMPTOMS: "Do you have any other symptoms?" (e.g., chest pain, back pain, breathing difficulty, swelling, rash, fever, numbness,  weakness)     Lower back pain, passing blood in stool x 1 month  7. PREGNANCY: "Is there any chance you are pregnant?" "When was your last menstrual period?"     No  Protocols used: Leg Pain-A-AH

## 2020-09-24 NOTE — Patient Instructions (Signed)
Chronic Knee Pain, Adult °Chronic knee pain is pain in one or both knees that lasts longer than 3 months. Symptoms of chronic knee pain may include swelling, stiffness, and discomfort. Age-related wear and tear (osteoarthritis) of the knee joint is the most common cause of chronic knee pain. Other possible causes include: °A long-term immune-related disease that causes inflammation of the knee (rheumatoid arthritis). This usually affects both knees. °Inflammatory arthritis, such as gout or pseudogout. °An injury to the knee that causes arthritis. °An injury to the knee that damages the ligaments. Ligaments are strong tissues that connect bones to each other. °Runner's knee or pain behind the kneecap. °Treatment for chronic knee pain depends on the cause. The main treatments for chronic knee pain are physical therapy and weight loss. This condition may also be treated with medicines, injections, a knee sleeve or brace, and by using crutches. Rest, ice, pressure (compression), and elevation, also known as RICE therapy, may also be recommended. °Follow these instructions at home: °If you have a knee sleeve or brace: ° °Wear the knee sleeve or brace as told by your health care provider. Remove it only as told by your health care provider. °Loosen it if your toes tingle, become numb, or turn cold and blue. °Keep it clean. °If the sleeve or brace is not waterproof: °Do not let it get wet. °Remove it if allowed by your health care provider, or cover it with a watertight covering when you take a bath or a shower. °Managing pain, stiffness, and swelling °  °If directed, apply heat to the affected area as often as told by your health care provider. Use the heat source that your health care provider recommends, such as a moist heat pack or a heating pad. °If you have a removable knee sleeve or brace, remove it as told by your health care provider. °Place a towel between your skin and the heat source. °Leave the heat on for  20-30 minutes. °Remove the heat if your skin turns bright red. This is especially important if you are unable to feel pain, heat, or cold. You may have a greater risk of getting burned. °If directed, put ice on the affected area. To do this: °If you have a removable knee sleeve or brace, remove it as told by your health care provider. °Put ice in a plastic bag. °Place a towel between your skin and the bag. °Leave the ice on for 20 minutes, 2-3 times a day. °Remove the ice if your skin turns bright red. This is very important. If you cannot feel pain, heat, or cold, you have a greater risk of damage to the area. °Move your toes often to reduce stiffness and swelling. °Raise (elevate) the injured area above the level of your heart while you are sitting or lying down. °Activity °Avoid high-impact activities or exercises, such as running, jumping rope, or doing jumping jacks. °Follow the exercise plan that your health care provider designed for you. Your health care provider may suggest that you: °Avoid activities that make knee pain worse. This may require you to change your exercise routines, sport participation, or job duties. °Wear shoes with cushioned soles. °Avoid sports that require running and sudden changes in direction. °Do physical therapy. Physical therapy is planned to match your needs and abilities. It may include exercises for strength, flexibility, stability, and endurance. °Do exercises that increase balance and strength, such as tai chi and yoga. °Do not use the injured limb to support your body weight   until your health care provider says that you can. Use crutches as told by your health care provider. °Return to your normal activities as told by your health care provider. Ask your health care provider what activities are safe for you. °General instructions °Take over-the-counter and prescription medicines only as told by your health care provider. °Lose weight if you are overweight. Losing even a  little weight can reduce knee pain. Ask your health care provider what your ideal weight is, and how to safely lose extra weight. A dietitian may be able to help you plan your meals. °Do not use any products that contain nicotine or tobacco, such as cigarettes, e-cigarettes, and chewing tobacco. These can delay healing. If you need help quitting, ask your health care provider. °Keep all follow-up visits. This is important. °Contact a health care provider if: °You have knee pain that is not getting better or gets worse. °You are unable to do your physical therapy exercises due to knee pain. °Get help right away if: °Your knee swells and the swelling becomes worse. °You cannot move your knee. °You have severe knee pain. °Summary °Knee pain that lasts more than 3 months is considered chronic knee pain. °The main treatments for chronic knee pain are physical therapy and weight loss. You may also need to take medicines, wear a knee sleeve or brace, use crutches, and apply ice or heat. °Losing even a little weight can reduce knee pain. Ask your health care provider what your ideal weight is, and how to safely lose extra weight. A dietitian may be able to help you plan your meals. °Follow the exercise plan that your health care provider designed for you. °This information is not intended to replace advice given to you by your health care provider. Make sure you discuss any questions you have with your health care provider. °Document Revised: 06/27/2019 Document Reviewed: 06/27/2019 °Elsevier Patient Education © 2022 [employer] ° °

## 2020-10-03 ENCOUNTER — Telehealth: Payer: Self-pay | Admitting: Clinical

## 2020-10-30 ENCOUNTER — Institutional Professional Consult (permissible substitution) (INDEPENDENT_AMBULATORY_CARE_PROVIDER_SITE_OTHER): Payer: Self-pay | Admitting: Clinical

## 2021-02-04 ENCOUNTER — Other Ambulatory Visit: Payer: Self-pay

## 2021-02-04 ENCOUNTER — Ambulatory Visit (INDEPENDENT_AMBULATORY_CARE_PROVIDER_SITE_OTHER): Payer: Managed Care, Other (non HMO) | Admitting: Primary Care

## 2021-02-04 ENCOUNTER — Encounter (INDEPENDENT_AMBULATORY_CARE_PROVIDER_SITE_OTHER): Payer: Self-pay | Admitting: Primary Care

## 2021-02-04 VITALS — BP 126/85 | HR 62 | Temp 97.5°F | Ht 67.0 in | Wt 234.8 lb

## 2021-02-04 DIAGNOSIS — M238X2 Other internal derangements of left knee: Secondary | ICD-10-CM

## 2021-02-04 DIAGNOSIS — Z131 Encounter for screening for diabetes mellitus: Secondary | ICD-10-CM

## 2021-02-04 DIAGNOSIS — Z6836 Body mass index (BMI) 36.0-36.9, adult: Secondary | ICD-10-CM

## 2021-02-04 DIAGNOSIS — M25372 Other instability, left ankle: Secondary | ICD-10-CM

## 2021-02-04 DIAGNOSIS — G5603 Carpal tunnel syndrome, bilateral upper limbs: Secondary | ICD-10-CM | POA: Diagnosis not present

## 2021-02-04 LAB — POCT GLYCOSYLATED HEMOGLOBIN (HGB A1C): Hemoglobin A1C: 5.5 % (ref 4.0–5.6)

## 2021-02-04 NOTE — Patient Instructions (Signed)

## 2021-02-04 NOTE — Progress Notes (Signed)
Rebecca Fields, is a 51 y.o. female  [id number]  [id number]  DOB - April 25, 1970  Chief Complaint  Patient presents with   Crepitus of joint of left knee   Hand Pain    Bi-lateral feels numb    Ankle Pain    Left radiates up leg       Subjective:   Mr. Rebecca Fields is a 51 y.o. female here today for a follow up visit. He voices pain in his left knee, left ankle, popping of left knee and paresthesias of the hands.Patient has No headache, No chest pain, No abdominal pain - No Nausea, No new weakness tingling or numbness, No Cough - SOB.  No problems updated.  ALLERGIES: No Known Allergies  PAST MEDICAL HISTORY: Past Medical History:  Diagnosis Date   Gastric ulcer     MEDICATIONS AT HOME: Prior to Admission medications   Medication Sig Start Date End Date Taking? Authorizing Provider  acetaminophen (TYLENOL) 500 MG tablet Take 2 tablets (1,000 mg total) by mouth every 6 (six) hours as needed for mild pain or fever. Patient not taking: Reported on 02/04/2021 04/04/20   Norm Parcel, PA-C  fluticasone [facility]) 50 MCG/ACT nasal spray Place 1 spray into both nostrils daily. Patient not taking: Reported on 02/04/2021 07/12/20   Dorie Rank, MD  loratadine (CLARITIN) 10 MG tablet Take 1 tablet (10 mg total) by mouth daily for 14 days. 07/12/20 07/26/20  Dorie Rank, MD  Multiple Vitamin (MULTIVITAMIN WITH MINERALS) TABS tablet Take 1 tablet by mouth daily. Patient not taking: Reported on 02/04/2021    [provider]  naproxen (NAPROSYN) 500 MG tablet Take 1 tablet (500 mg total) by mouth 2 (two) times daily with a meal. Patient not taking: Reported on 02/04/2021 09/24/20   Kerin Perna, NP    Objective:   Vitals:   02/04/21 1101  BP: 126/85  Pulse: 62  Temp: (!) 97.5 F (36.4 C)  TempSrc: Temporal  SpO2: 98%  Weight: 234 lb 12.8 oz (106.5 kg)  Height: 5\' 7"  (1.702 m)   Exam General appearance : Awake, alert, not in any distress. Speech Clear. Not  toxic looking HEENT: Atraumatic and Normocephalic, pupils equally reactive to light and accomodation Neck: Supple, no JVD. No cervical lymphadenopathy.  Chest: Good air entry bilaterally, no added sounds  CVS: S1 S2 regular, no murmurs.  Abdomen: Bowel sounds present, Non tender and not distended with no gaurding, rigidity or rebound. Extremities: B/L Lower Ext shows no edema, both legs are warm to touch. Left knee crepitus present  Neurology: Awake alert, and oriented X 3, CN II-XII intact, Non focal Skin: No Rash  Data Review Lab Results  Component Value Date   HGBA1C 5.5 02/04/2021    Assessment & Plan   1. Screening for diabetes mellitus - HgB A1c 5.5 per ADA guideline not a pre/diabetic   2. Crepitus - Ambulatory referral to Orthopedic Surgery  3. Left ankle gives way - Ambulatory referral to Orthopedic Surgery  4. Bilateral carpal tunnel syndrome  Ambulatory referral to Orthopedic Surgery Encourage patient to apply for [facility] assistance after she applied for Medicaid specialist may require payment at the time of scheduled appt. Patient is made aware and understand.  Patient have been counseled extensively about nutrition and exercise. Other issues discussed during this visit include: low cholesterol diet, weight control and daily exercise, foot care, annual eye examinations at Ophthalmology, importance of adherence with medications and regular follow-up. We also discussed long term complications  of uncontrolled diabetes and hypertension.   Return for pap/mam.  This note has been created with Surveyor, quantity. Any transcriptional errors are unintentional.    Kerin Perna, 02/09/2021, 8:50 PM

## 2021-02-05 LAB — CMP14+EGFR
ALT: 12 IU/L (ref 0–32)
AST: 18 IU/L (ref 0–40)
Albumin/Globulin Ratio: 1.8 (ref 1.2–2.2)
Albumin: 3.8 g/dL (ref 3.8–4.8)
Alkaline Phosphatase: 67 IU/L (ref 44–121)
BUN/Creatinine Ratio: 12 (ref 9–23)
BUN: 9 mg/dL (ref 6–24)
Bilirubin Total: 0.2 mg/dL (ref 0.0–1.2)
CO2: 21 mmol/L (ref 20–29)
Calcium: 8.8 mg/dL (ref 8.7–10.2)
Chloride: 106 mmol/L (ref 96–106)
Creatinine, Ser: 0.77 mg/dL (ref 0.57–1.00)
Globulin, Total: 2.1 g/dL (ref 1.5–4.5)
Glucose: 82 mg/dL (ref 70–99)
Potassium: 4.3 mmol/L (ref 3.5–5.2)
Sodium: 140 mmol/L (ref 134–144)
Total Protein: 5.9 g/dL — ABNORMAL LOW (ref 6.0–8.5)
eGFR: 94 mL/min/{1.73_m2} (ref >59)

## 2021-02-05 LAB — LIPID PANEL
Chol/HDL Ratio: 3.7 ratio (ref 0.0–4.4)
Cholesterol, Total: 184 mg/dL (ref 100–199)
HDL: 50 mg/dL (ref >39)
LDL Chol Calc (NIH): 117 mg/dL — ABNORMAL HIGH (ref 0–99)
Triglycerides: 95 mg/dL (ref 0–149)
VLDL Cholesterol Cal: 17 mg/dL (ref 5–40)

## 2021-02-12 ENCOUNTER — Other Ambulatory Visit: Payer: Self-pay

## 2021-02-12 ENCOUNTER — Ambulatory Visit (INDEPENDENT_AMBULATORY_CARE_PROVIDER_SITE_OTHER): Payer: Managed Care, Other (non HMO) | Admitting: Orthopaedic Surgery

## 2021-02-12 ENCOUNTER — Encounter: Payer: Self-pay | Admitting: Orthopaedic Surgery

## 2021-02-12 ENCOUNTER — Ambulatory Visit (INDEPENDENT_AMBULATORY_CARE_PROVIDER_SITE_OTHER): Payer: Managed Care, Other (non HMO)

## 2021-02-12 ENCOUNTER — Telehealth (INDEPENDENT_AMBULATORY_CARE_PROVIDER_SITE_OTHER): Payer: Self-pay

## 2021-02-12 VITALS — Ht 67.0 in | Wt 228.0 lb

## 2021-02-12 DIAGNOSIS — G8929 Other chronic pain: Secondary | ICD-10-CM | POA: Diagnosis not present

## 2021-02-12 DIAGNOSIS — M25562 Pain in left knee: Secondary | ICD-10-CM

## 2021-02-12 DIAGNOSIS — G5602 Carpal tunnel syndrome, left upper limb: Secondary | ICD-10-CM

## 2021-02-12 DIAGNOSIS — G5601 Carpal tunnel syndrome, right upper limb: Secondary | ICD-10-CM

## 2021-02-12 MED ORDER — BUPIVACAINE HCL 0.5 % IJ SOLN
2.0000 mL | INTRAMUSCULAR | Status: AC | PRN
  Administered 2021-02-12: 2 mL via INTRA_ARTICULAR

## 2021-02-12 MED ORDER — LIDOCAINE HCL 1 % IJ SOLN
2.0000 mL | INTRAMUSCULAR | Status: AC | PRN
  Administered 2021-02-12: 2 mL

## 2021-02-12 MED ORDER — METHYLPREDNISOLONE ACETATE 40 MG/ML IJ SUSP
40.0000 mg | INTRAMUSCULAR | Status: AC | PRN
  Administered 2021-02-12: 40 mg via INTRA_ARTICULAR

## 2021-02-12 NOTE — Telephone Encounter (Signed)
I spoke with this pt and scheduled appt for 03/05/21. She previously no showed

## 2021-02-12 NOTE — Progress Notes (Signed)
Office Visit Note   Patient: Rebecca Fields           Date of Birth: 27-May-1970           MRN: [id number] Visit Date: 02/12/2021              Requested by: Grayce Sessions, NP [address] [place],  [place] [address] PCP: Grayce Sessions, NP   Assessment & Plan: Visit Diagnoses:  1. Chronic pain of left knee   2. Right carpal tunnel syndrome   3. Left carpal tunnel syndrome     Plan: Impression is moderate left knee OA and bilateral carpal tunnel syndrome.  Based on options she elected to undergo a left knee injection today.  For the hands we will obtain nerve conduction studies to assess for carpal tunnel syndrome.  Follow-up afterwards.  Follow-Up Instructions: No follow-ups on file.   Orders:  Orders Placed This Encounter  Procedures   Large Joint Inj: L knee   XR KNEE 3 VIEW LEFT   No orders of the defined types were placed in this encounter.     Procedures: Large Joint Inj: L knee on 02/12/2021 10:17 AM Details: 22 G needle Medications: 2 mL bupivacaine 0.5 %; 2 mL lidocaine 1 %; 40 mg methylPREDNISolone acetate 40 MG/ML Outcome: tolerated well, no immediate complications Patient was prepped and draped in the usual sterile fashion.      Clinical Data: No additional findings.   Subjective: Chief Complaint  Patient presents with   Left Knee - Pain   Left Hand - Pain, Numbness   Right Hand - Pain, Numbness    Rebecca Fields is a 51 year old female who comes in for chronic left knee pain for 5 years.  Denies any injuries.  She has constant pain that swells periodically worse with activity and standing.  Denies any mechanical symptoms.  Reports a lot of grinding with using steps.  She also endorses bilateral hand numbness and was diagnosed with carpal tunnel syndrome years ago in the left hand.  She is right-hand dominant takes naproxen.  Wears braces at nighttime but she has noticed that they have become ineffective.   Review of Systems   Constitutional: Negative.   HENT: Negative.    Eyes: Negative.   Respiratory: Negative.    Cardiovascular: Negative.   Endocrine: Negative.   Musculoskeletal: Negative.   Neurological: Negative.   Hematological: Negative.   Psychiatric/Behavioral: Negative.    All other systems reviewed and are negative.   Objective: Vital Signs: Ht 5\' 7"  (1.702 m)    Wt 228 lb (103.4 kg)    BMI 35.71 kg/m   Physical Exam Vitals and nursing note reviewed.  Constitutional:      Appearance: She is well-developed.  Pulmonary:     Effort: Pulmonary effort is normal.  Skin:    General: Skin is warm.     Capillary Refill: Capillary refill takes less than 2 seconds.  Neurological:     Mental Status: She is alert and oriented to person, place, and time.  Psychiatric:        Behavior: Behavior normal.        Thought Content: Thought content normal.        Judgment: Judgment normal.    Ortho Exam  Bilateral hands show subjective numbness to the fingertips.  Negative carpal tunnel compressive signs. Examination left knee shows trace effusion.  Patellofemoral crepitus with range of motion which is well-preserved.  Collaterals and cruciates are stable.  No joint line tenderness.  Specialty Comments:  No specialty comments available.  Imaging: XR KNEE 3 VIEW LEFT  Result Date: 02/12/2021 Moderate tricompartmental DJD with spurring and joint space narrowing    PMFS History: Patient Active Problem List   Diagnosis Date Noted   Acute appendicitis 04/03/2020   Past Medical History:  Diagnosis Date   Gastric ulcer     No family history on file.  Past Surgical History:  Procedure Laterality Date   FINGER SURGERY     LAPAROSCOPIC APPENDECTOMY N/A 04/03/2020   Procedure: APPENDECTOMY LAPAROSCOPIC;  Surgeon: Abigail Miyamoto, MD;  Location: [facility] OR;  Service: General;  Laterality: N/A;   Social History   Occupational History   Not on file  Tobacco Use   Smoking status: Every Day     Packs/day: 0.50    Types: Cigarettes   Smokeless tobacco: Never  Vaping Use   Vaping Use: Never used  Substance and Sexual Activity   Alcohol use: Yes    Comment: Occasionally    Drug use: Yes    Types: Marijuana    Comment: occasionally   Sexual activity: Yes    Birth control/protection: None

## 2021-02-12 NOTE — Telephone Encounter (Signed)
-----   Message from Grayce Sessions, NP sent at 02/11/2021  9:10 PM EST ----- Labs are normal except LDL elevated this is considered the bad cholesterol. Start with diet modification first  Healthy lifestyle diet of fruits vegetables fish nuts whole grains and low saturated fat . Decrease foods high in cholesterol or liver, fatty meats,cheese, butter avocados, nuts and seeds, chocolate and fried foods.

## 2021-02-12 NOTE — Telephone Encounter (Signed)
Spoke with patient and confirmed I was speaking with the right person by having her verify date of birth. She is aware of normal labs except elevated LDL(bad cholesterol). At this time no medication. Start with diet modifications such as increasing intake of fruit, veggies, nuts whole grains and foods low in fat. Will recheck in 6 months. She did not have any questions and verbalized understanding of results. Nat Christen, CMA

## 2021-02-16 ENCOUNTER — Ambulatory Visit (INDEPENDENT_AMBULATORY_CARE_PROVIDER_SITE_OTHER): Payer: Self-pay | Admitting: Primary Care

## 2021-02-26 ENCOUNTER — Ambulatory Visit (INDEPENDENT_AMBULATORY_CARE_PROVIDER_SITE_OTHER): Payer: Managed Care, Other (non HMO) | Admitting: Primary Care

## 2021-02-26 ENCOUNTER — Encounter (INDEPENDENT_AMBULATORY_CARE_PROVIDER_SITE_OTHER): Payer: Self-pay | Admitting: Primary Care

## 2021-02-26 ENCOUNTER — Other Ambulatory Visit: Payer: Self-pay

## 2021-02-26 ENCOUNTER — Other Ambulatory Visit (HOSPITAL_COMMUNITY)
Admission: RE | Admit: 2021-02-26 | Discharge: 2021-02-26 | Disposition: A | Discharge status: Home / Self Care | Payer: Commercial Managed Care - HMO | Source: Ambulatory Visit | Attending: Primary Care | Admitting: Primary Care

## 2021-02-26 VITALS — BP 114/83 | HR 72 | Temp 97.7°F | Ht 67.0 in | Wt 230.8 lb

## 2021-02-26 DIAGNOSIS — Z113 Encounter for screening for infections with a predominantly sexual mode of transmission: Secondary | ICD-10-CM | POA: Diagnosis not present

## 2021-02-26 DIAGNOSIS — Z124 Encounter for screening for malignant neoplasm of cervix: Secondary | ICD-10-CM | POA: Insufficient documentation

## 2021-02-26 DIAGNOSIS — Z1211 Encounter for screening for malignant neoplasm of colon: Secondary | ICD-10-CM

## 2021-02-26 DIAGNOSIS — Z1231 Encounter for screening mammogram for malignant neoplasm of breast: Secondary | ICD-10-CM

## 2021-02-26 NOTE — Progress Notes (Signed)
°  [facility]  WELL-WOMAN PHYSICAL & PAP Patient name: Rebecca Fields MRN [id number]  Date of birth: 1970-08-06 Chief Complaint:   Gynecologic Exam  History of Present Illness:   Rebecca Fields is a 51 y.o. No obstetric history on file. female being seen today for a routine well-woman exam.   CC:@visit  info@   The current method of family planning is IUD.  Patient's last menstrual period was 02/16/2021 (approximate). Last pap unknown. Results were:  Last mammogram: n/a.  Family h/o breast cancer: Yes Last colonoscopy: referral . Results were:  none . Family h/o colorectal cancer: No  Review of Systems:    Denies any headaches, blurred vision, fatigue, shortness of breath, chest pain, abdominal pain, abnormal vaginal discharge/itching/odor/irritation, problems with periods, bowel movements, urination, or intercourse unless otherwise stated above.  Pertinent History Reviewed:   Reviewed past medical,surgical, social and family history.  Reviewed problem list, medications and allergies.  Physical Assessment:   Vitals:   02/26/21 1615  BP: 114/83  Pulse: 72  Temp: 97.7 F (36.5 C)  TempSrc: Oral  SpO2: 100%  Weight: 230 lb 12.8 oz (104.7 kg)  Height: 5\' 7"  (1.702 m)  Body mass index is 36.15 kg/m.        Physical Examination:  General appearance - well appearing, and in no distress Mental status - alert, oriented to person, place, and time Psych:  She has a normal mood and affect Skin - warm and dry, normal color, no suspicious lesions noted Chest - effort normal, all lung fields clear to auscultation bilaterally Heart - normal rate and regular rhythm Neck:  midline trachea, no thyromegaly or nodules Breasts - breasts appear normal, no suspicious masses, no skin or nipple changes or axillary nodes Educated patient on proper self breast examination and had patient to demonstrate SBE. Abdomen - soft, nontender, nondistended, no masses or  organomegaly Pelvic-VULVA: normal appearing vulva with no masses, tenderness or lesions   VAGINA: normal appearing vagina with normal color and discharge, no lesions   CERVIX: normal appearing cervix without discharge or lesions, no CMT UTERUS: uterus is felt to be normal size, shape, consistency and nontender  ADNEXA: No adnexal masses or tenderness noted. Extremities:  No swelling or varicosities noted  No results found for this or any previous visit (from the past 24 hour(s)).   Assessment & Plan:  Rebecca Fields was seen today for gynecologic exam.  Diagnoses and all orders for this visit:  Breast cancer screening by mammogram -     MM DIGITAL SCREENING BILATERAL; Future  Cervical cancer screening -     Cytology - PAP([place])  Screening for STD (sexually transmitted disease) -     Cervicovaginal ancillary only -     HIV antibody (with reflex); Future  Colon cancer screening -     Ambulatory referral to Gastroenterology    Orders Placed This Encounter  Procedures   MM DIGITAL SCREENING BILATERAL   HIV antibody (with reflex)   Ambulatory referral to Gastroenterology    Meds: No orders of the defined types were placed in this encounter.   Follow-up: Return if symptoms worsen or fail to improve.  This note has been created with Kennyth Arnold. Any transcriptional errors are unintentional.   Education officer, environmental, NP 03/01/2021, 10:22 PM

## 2021-02-26 NOTE — Patient Instructions (Signed)
Pap Test °Why am I having this test? °A Pap test, also called a Pap smear, is a screening test to check for signs of: °Infection. °Cancer of the cervix. The cervix is the lower part of the uterus that opens into the vagina. °Changes that may be a sign that cancer is developing (precancerous changes). °Women need this test on a regular basis. In general, you should have a Pap test every 3 years until you reach menopause or age 51. Women aged 30-60 may choose to have their Pap test done at the same time as an HPV (human papillomavirus) test every 5 years (instead of every 3 years). °Your health care provider may recommend having Pap tests more or less often depending on your medical conditions and past Pap test results. °What is being tested? °Cervical cells are tested for signs of infection or abnormalities. °What kind of sample is taken? °Your health care provider will collect a sample of cells from the surface of your cervix. This will be done using a small cotton swab, plastic spatula, or brush that is inserted into your vagina using a tool called a speculum. This sample is often collected during a pelvic exam, when you are lying on your back on an exam table with your feet in footrests (stirrups). In some cases, fluids (secretions) from the cervix or vagina may also be collected. °How do I prepare for this test? °Be aware of where you are in your menstrual cycle. If you are menstruating on the day of the test, you may be asked to reschedule. °You may need to reschedule if you have a known vaginal infection on the day of the test. °Follow instructions from your health care provider about: °Changing or stopping your regular medicines. Some medicines can cause abnormal test results, such as vaginal medicines and tetracycline. °Avoiding douching 2-3 days before or the day of the test. °Tell a health care provider about: °Any allergies you have. °All medicines you are taking, including vitamins, herbs, eye drops,  creams, and over-the-counter medicines. °Any bleeding problems you have. °Any surgeries you have had. °Any medical conditions you have. °Whether you are pregnant or may be pregnant. °How are the results reported? °Your test results will be reported as either abnormal or normal. °What do the results mean? °A normal test result means that you do not have signs of cancer of the cervix. °An abnormal result may mean that you have: °Cancer. A Pap test by itself is not enough to diagnose cancer. You will have more tests done if cancer is suspected. °Precancerous changes in your cervix. °Inflammation of the cervix. °An STI (sexually transmitted infection). °A fungal infection. °A parasite infection. °Talk with your health care provider about what your results mean. In some cases, your health care provider may do more testing to confirm the results. °Questions to ask your health care provider °Ask your health care provider, or the department that is doing the test: °When will my results be ready? °How will I get my results? °What are my treatment options? °What other tests do I need? °What are my next steps? °Summary °In general, women should have a Pap test every 3 years until they reach menopause or age 51. °Your health care provider will collect a sample of cells from the surface of your cervix. This will be done using a small cotton swab, plastic spatula, or brush. °In some cases, fluids (secretions) from the cervix or vagina may also be collected. °This information is not intended   to replace advice given to you by your health care provider. Make sure you discuss any questions you have with your health care provider. °Document Revised: 04/11/2020 Document Reviewed: 04/11/2020 °Elsevier Patient Education © 2022 [employer] ° °

## 2021-03-02 ENCOUNTER — Other Ambulatory Visit (INDEPENDENT_AMBULATORY_CARE_PROVIDER_SITE_OTHER): Payer: Self-pay | Admitting: Primary Care

## 2021-03-02 DIAGNOSIS — B9689 Other specified bacterial agents as the cause of diseases classified elsewhere: Secondary | ICD-10-CM

## 2021-03-02 DIAGNOSIS — N760 Acute vaginitis: Secondary | ICD-10-CM

## 2021-03-02 LAB — CERVICOVAGINAL ANCILLARY ONLY
Bacterial Vaginitis (gardnerella): POSITIVE — AB
Candida Glabrata: NEGATIVE
Candida Vaginitis: NEGATIVE
Chlamydia: NEGATIVE
Comment: NEGATIVE
Comment: NEGATIVE
Comment: NEGATIVE
Comment: NEGATIVE
Comment: NEGATIVE
Comment: NORMAL
Neisseria Gonorrhea: NEGATIVE
Trichomonas: NEGATIVE

## 2021-03-02 MED ORDER — METRONIDAZOLE 500 MG PO TABS: 500.0000 mg | ORAL_TABLET | Freq: Two times a day (BID) | ORAL | 0 refills | Status: DC

## 2021-03-03 ENCOUNTER — Telehealth (INDEPENDENT_AMBULATORY_CARE_PROVIDER_SITE_OTHER): Payer: Self-pay

## 2021-03-03 LAB — CYTOLOGY - PAP
Comment: NEGATIVE
Diagnosis: NEGATIVE
High risk HPV: NEGATIVE

## 2021-03-03 NOTE — Telephone Encounter (Signed)
Patient date of birth verified. She is aware of normal pap. STD/infection was positive for BV. Medication sent to pharmacy. Take twice daily for 7 days. Do not drink alcohol while taking. She verbalized understanding. Maryjean Morn, CMA

## 2021-03-03 NOTE — Telephone Encounter (Signed)
-----   Message from Grayce Sessions, NP sent at 03/03/2021  3:09 PM EST ----- Pap normal re check in 3 years

## 2021-03-05 ENCOUNTER — Other Ambulatory Visit: Payer: Self-pay

## 2021-03-05 ENCOUNTER — Ambulatory Visit (INDEPENDENT_AMBULATORY_CARE_PROVIDER_SITE_OTHER): Payer: Managed Care, Other (non HMO) | Admitting: Clinical

## 2021-03-05 ENCOUNTER — Other Ambulatory Visit (INDEPENDENT_AMBULATORY_CARE_PROVIDER_SITE_OTHER): Payer: Self-pay | Admitting: Primary Care

## 2021-03-05 ENCOUNTER — Ambulatory Visit
Admission: RE | Admit: 2021-03-05 | Discharge: 2021-03-05 | Disposition: A | Discharge status: Home / Self Care | Payer: Managed Care, Other (non HMO) | Source: Ambulatory Visit | Attending: Primary Care | Admitting: Primary Care

## 2021-03-05 DIAGNOSIS — Z1231 Encounter for screening mammogram for malignant neoplasm of breast: Secondary | ICD-10-CM

## 2021-03-05 NOTE — BH Specialist Note (Signed)
Pt came in however stated that she had to be at work at 11:30. She stated that she thought she was seeing her PCP and that she no longer needs help with anxiety. Stated that she no longer deals with anxiety.

## 2021-06-25 ENCOUNTER — Ambulatory Visit (INDEPENDENT_AMBULATORY_CARE_PROVIDER_SITE_OTHER): Payer: Self-pay | Admitting: *Deleted

## 2021-06-25 NOTE — Telephone Encounter (Signed)
  Chief Complaint: chronic irregular cycle since March  Symptoms: irregular, heavy cycles, cramping, sweating at night Frequency: LMP- 5/20-present Pertinent Negatives: Patient denies   Disposition: [] ED /[] Urgent Care (no appt availability in office) / [x] Appointment(In office/virtual)/ []  [facility]/ [] Home Care/ [] Refused Recommended Disposition /[] [facility]/ []  Follow-up with PCP Additional Notes: Call to office- patient scheduled out of protocol- patient prefers to see provider.

## 2021-06-25 NOTE — Telephone Encounter (Signed)
Reason for Disposition  [1] Periods with > 6 soaked pads or tampons per day AND [2] last > 7 days  Answer Assessment - Initial Assessment Questions 1. AMOUNT: "Describe the bleeding that you are having."    - SPOTTING: spotting, or pinkish / brownish mucous discharge; does not fill panty liner or pad    - MILD:  less than 1 pad / hour; less than patient's usual menstrual bleeding   - MODERATE: 1-2 pads / hour; 1 menstrual cup every 6 hours; small-medium blood clots (e.g., pea, grape, small coin)   - SEVERE: soaking 2 or more pads/hour for 2 or more hours; 1 menstrual cup every 2 hours; bleeding not contained by pads or continuous red blood from vagina; large blood clots (e.g., golf ball, large coin)      Moderate, minimal clots 2. ONSET: "When did the bleeding begin?" "Is it continuing now?"     March- patient has prolonged bleeding- on/off bleeding 3. MENSTRUAL PERIOD: "When was the last normal menstrual period?" "How is this different than your period?"     5/20- until now, previous 5/10-14 4. REGULARITY: "How regular are your periods?"     Last regular cycle- January/February- no irregular cycle or cramping before 5. ABDOMINAL PAIN: "Do you have any pain?" "How bad is the pain?"  (e.g., Scale 1-10; mild, moderate, or severe)   - MILD (1-3): doesn't interfere with normal activities, abdomen soft and not tender to touch    - MODERATE (4-7): interferes with normal activities or awakens from sleep, abdomen tender to touch    - SEVERE (8-10): excruciating pain, doubled over, unable to do any normal activities      moderate 6. PREGNANCY: "Could you be pregnant?" "Are you sexually active?" "Did you recently give birth?"     No- no SA 7. BREASTFEEDING: "Are you breastfeeding?"     no 8. HORMONES: "Are you taking any hormone medications, prescription or OTC?" (e.g., birth control pills, estrogen)     no 9. BLOOD THINNERS: "Do you take any blood thinners?" (e.g., Coumadin/warfarin,  Pradaxa/dabigatran, aspirin)     no 10. CAUSE: "What do you think is causing the bleeding?" (e.g., recent gyn surgery, recent gyn procedure; known bleeding disorder, cervical cancer, polycystic ovarian disease, fibroids)         Age 51. HEMODYNAMIC STATUS: "Are you weak or feeling lightheaded?" If Yes, ask: "Can you stand and walk normally?"        sometimes 12. OTHER SYMPTOMS: "What other symptoms are you having with the bleeding?" (e.g., passed tissue, vaginal discharge, fever, menstrual-type cramps)       Cramping, sweating  Protocols used: Vaginal Bleeding - Abnormal-A-AH

## 2021-07-01 ENCOUNTER — Ambulatory Visit (INDEPENDENT_AMBULATORY_CARE_PROVIDER_SITE_OTHER): Payer: Commercial Managed Care - HMO | Admitting: Primary Care

## 2021-07-13 ENCOUNTER — Other Ambulatory Visit: Payer: Self-pay

## 2021-07-13 ENCOUNTER — Emergency Department (HOSPITAL_BASED_OUTPATIENT_CLINIC_OR_DEPARTMENT_OTHER): Payer: Self-pay | Admitting: Radiology

## 2021-07-13 ENCOUNTER — Encounter (HOSPITAL_BASED_OUTPATIENT_CLINIC_OR_DEPARTMENT_OTHER): Payer: Self-pay

## 2021-07-13 ENCOUNTER — Emergency Department (HOSPITAL_BASED_OUTPATIENT_CLINIC_OR_DEPARTMENT_OTHER)
Admission: EM | Admit: 2021-07-13 | Discharge: 2021-07-13 | Disposition: A | Discharge status: Home / Self Care | Payer: Self-pay | Attending: Emergency Medicine | Admitting: Emergency Medicine

## 2021-07-13 DIAGNOSIS — M79605 Pain in left leg: Secondary | ICD-10-CM | POA: Insufficient documentation

## 2021-07-13 DIAGNOSIS — Z5321 Procedure and treatment not carried out due to patient leaving prior to being seen by health care provider: Secondary | ICD-10-CM | POA: Insufficient documentation

## 2021-07-13 NOTE — ED Notes (Signed)
Pt was seen leaving by registation   

## 2021-07-13 NOTE — ED Triage Notes (Signed)
Patient here POV From Home.  Occurred approximately at 1830. 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 Endorses Pain to Lateral Aspect of Left Leg.   No Known Head Injury. No LOC  NAD Noted during Triage. A&Ox4. GCS 15. Ambulatory.

## 2021-07-14 ENCOUNTER — Emergency Department (HOSPITAL_BASED_OUTPATIENT_CLINIC_OR_DEPARTMENT_OTHER)
Admission: EM | Admit: 2021-07-14 | Discharge: 2021-07-14 | Disposition: A | Discharge status: Home / Self Care | Payer: Self-pay | Attending: Emergency Medicine | Admitting: Emergency Medicine

## 2021-07-14 ENCOUNTER — Encounter (HOSPITAL_BASED_OUTPATIENT_CLINIC_OR_DEPARTMENT_OTHER): Payer: Self-pay | Admitting: Emergency Medicine

## 2021-07-14 DIAGNOSIS — S8012XA Contusion of left lower leg, initial encounter: Secondary | ICD-10-CM | POA: Insufficient documentation

## 2021-07-14 DIAGNOSIS — W228XXA Striking against or struck by other objects, initial encounter: Secondary | ICD-10-CM | POA: Insufficient documentation

## 2021-07-14 DIAGNOSIS — Y990 Civilian activity done for income or pay: Secondary | ICD-10-CM | POA: Insufficient documentation

## 2021-07-14 NOTE — ED Provider Notes (Signed)
[facility] EMERGENCY DEPT Provider Note   CSN: [id number] Arrival date & time: 07/14/21  3614     History  Chief Complaint  Patient presents with   Leg Pain    Rebecca Fields is a 51 y.o. female.  Patient is a 51 year old female with a history of gastric ulcer and left knee arthritis who is presenting today with ongoing left leg pain after being hit by a pallet jack yesterday at work.  Patient reports she was standing there when it hit her left side causing her to be knocked over.  She has had ongoing pain in her lateral thigh and lateral tib-fib area since the accident.  When she woke up this morning it was worse.  She was seen in the ER yesterday and had images done but the wait was too long and she decided to go home.  She did take some Advil before going to bed.  She denies any issue with her hip or knee.  She said it is just very sore when moving her leg.  She is able to ambulate.  The history is provided by the patient.  Leg Pain      Home Medications Prior to Admission medications   Medication Sig Start Date End Date Taking? Authorizing Provider  acetaminophen (TYLENOL) 500 MG tablet Take 2 tablets (1,000 mg total) by mouth every 6 (six) hours as needed for mild pain or fever. 04/04/20   Juliet Rude, PA-C  fluticasone (FLONASE) 50 MCG/ACT nasal spray Place 1 spray into both nostrils daily. 07/12/20   Linwood Dibbles, MD  loratadine (CLARITIN) 10 MG tablet Take 1 tablet (10 mg total) by mouth daily for 14 days. 07/12/20 07/26/20  Linwood Dibbles, MD  metroNIDAZOLE (FLAGYL) 500 MG tablet Take 1 tablet (500 mg total) by mouth 2 (two) times daily. 03/02/21   Grayce Sessions, NP  Multiple Vitamin (MULTIVITAMIN WITH MINERALS) TABS tablet Take 1 tablet by mouth daily.    [provider]      Allergies    Patient has no known allergies.    Review of Systems   Review of Systems  Physical Exam Updated Vital Signs BP (!) 151/96 (BP Location: Right Arm)    Pulse 85   Temp 97.7 F (36.5 C) (Temporal)   Resp 18   SpO2 100%  Physical Exam Vitals and nursing note reviewed.  Musculoskeletal:        General: Tenderness present.     Left knee: Normal.       Legs:  Skin:    General: Skin is warm and dry.  Neurological:     Mental Status: She is alert.     ED Results / Procedures / Treatments   Labs (all labs ordered are listed, but only abnormal results are displayed) Labs Reviewed - No data to display  EKG None  Radiology DG FEMUR MIN 2 VIEWS LEFT  Result Date: 07/13/2021 CLINICAL DATA:  Blunt trauma to the left leg with pain, initial encounter EXAM: LEFT FEMUR 2 VIEWS COMPARISON:  None Available. FINDINGS: Degenerative changes of the hip and knee joints are seen. No acute fracture or dislocation is noted. No soft tissue abnormality is noted. IMPRESSION: Generative change without acute abnormality. Electronically Signed   By: Alcide Clever M.D.   On: 07/13/2021 21:39   DG Tibia/Fibula Left  Result Date: 07/13/2021 CLINICAL DATA:  Blunt trauma. EXAM: LEFT TIBIA AND FIBULA - 2 VIEW COMPARISON:  None Available. FINDINGS: There is no evidence of  fracture or other focal bone lesions. Soft tissues are unremarkable. IMPRESSION: No fracture or dislocation. Electronically Signed   By: Genevive Bi M.D.   On: 07/13/2021 21:37    Procedures Procedures    Medications Ordered in ED Medications - No data to display  ED Course/ Medical Decision Making/ A&P                           Medical Decision Making  Patient presenting today with ongoing leg pain after an injury yesterday.  Patient had imaging done in the emergency room last night which I independently visualized and interpreted which shows no evidence of acute fracture.  Feel that patient's symptoms are related to soft tissue contusion.  She is able to ambulate and neurovascularly intact.  We will have her continue Advil and Tylenol.        Final Clinical Impression(s) / ED  Diagnoses Final diagnoses:  Contusion of left lower extremity, initial encounter    Rx / DC Orders ED Discharge Orders     None         Gwyneth Sprout, MD 07/14/21 [id number]

## 2021-07-14 NOTE — Discharge Instructions (Signed)
Thankfully the x-rays today showed no signs of broken bones.  You are just bruised badly from the accident.  Take Advil and Tylenol as needed for pain.  Also ice may be helpful.

## 2021-07-14 NOTE — ED Triage Notes (Signed)
Pt arrives to ED with c/o left leg pain after being hit by a pallet jack at work last night. Was here yesterday but LWBS after triage.

## 2021-08-26 ENCOUNTER — Ambulatory Visit (INDEPENDENT_AMBULATORY_CARE_PROVIDER_SITE_OTHER): Payer: Self-pay | Admitting: Orthopaedic Surgery

## 2021-08-26 ENCOUNTER — Encounter: Payer: Self-pay | Admitting: Orthopaedic Surgery

## 2021-08-26 DIAGNOSIS — M1712 Unilateral primary osteoarthritis, left knee: Secondary | ICD-10-CM

## 2021-08-26 MED ORDER — BUPIVACAINE HCL 0.5 % IJ SOLN
2.0000 mL | INTRAMUSCULAR | Status: AC | PRN
  Administered 2021-08-26: 2 mL via INTRA_ARTICULAR

## 2021-08-26 MED ORDER — LIDOCAINE HCL 1 % IJ SOLN
2.0000 mL | INTRAMUSCULAR | Status: AC | PRN
  Administered 2021-08-26: 2 mL

## 2021-08-26 MED ORDER — METHYLPREDNISOLONE ACETATE 40 MG/ML IJ SUSP
40.0000 mg | INTRAMUSCULAR | Status: AC | PRN
  Administered 2021-08-26: 40 mg via INTRA_ARTICULAR

## 2021-08-26 NOTE — Progress Notes (Signed)
Office Visit Note   Patient: Rebecca Fields           Date of Birth: [birth year]           MRN: [id number] Visit Date: 08/26/2021              Requested by: Grayce Sessions, NP [address] [place],  [place] [address] PCP: Grayce Sessions, NP   Assessment & Plan: Visit Diagnoses:  1. Primary osteoarthritis of left knee     Plan: Impression is advanced left knee degenerative joint disease with effusion.  Aspiration cortisone injection administered today.  40 cc was aspirated.  Patient tolerated well.  Fluid was sent to the lab.  Follow-up as needed.  Patient currently still not mentally ready for knee replacement.  Follow-Up Instructions: No follow-ups on file.   Orders:  No orders of the defined types were placed in this encounter.  No orders of the defined types were placed in this encounter.     Procedures: Large Joint Inj: L knee on 08/26/2021 8:59 AM Details: 22 G needle Medications: 2 mL bupivacaine 0.5 %; 2 mL lidocaine 1 %; 40 mg methylPREDNISolone acetate 40 MG/ML Outcome: tolerated well, no immediate complications Patient was prepped and draped in the usual sterile fashion.       Clinical Data: No additional findings.   Subjective: Chief Complaint  Patient presents with   Left Knee - Pain   Right Hand - Pain    HPI [name] returns today for chronic left knee pain and swelling.  Cortisone injection done in January has worn off.  Continues to have problems with daily activities due to the knee. Review of Systems  Constitutional: Negative.   HENT: Negative.    Eyes: Negative.   Respiratory: Negative.    Cardiovascular: Negative.   Endocrine: Negative.   Musculoskeletal: Negative.   Neurological: Negative.   Hematological: Negative.   Psychiatric/Behavioral: Negative.    All other systems reviewed and are negative.    Objective: Vital Signs: There were no vitals taken for this visit.  Physical Exam Vitals and nursing note reviewed.   Constitutional:      Appearance: She is well-developed.  HENT:     Head: Normocephalic and atraumatic.  Pulmonary:     Effort: Pulmonary effort is normal.  Abdominal:     Palpations: Abdomen is soft.  Musculoskeletal:     Cervical back: Neck supple.  Skin:    General: Skin is warm.     Capillary Refill: Capillary refill takes less than 2 seconds.  Neurological:     Mental Status: She is alert and oriented to person, place, and time.  Psychiatric:        Behavior: Behavior normal.        Thought Content: Thought content normal.        Judgment: Judgment normal.     Ortho Exam Examination left knee shows a large joint effusion.  Crepitus throughout range of motion.  Joint line tenderness. Specialty Comments:  No specialty comments available.  Imaging: No results found.   PMFS History: Patient Active Problem List   Diagnosis Date Noted   Primary osteoarthritis of left knee 08/26/2021   Acute appendicitis 04/03/2020   Past Medical History:  Diagnosis Date   Gastric ulcer     Family History  Problem Relation Age of Onset   Breast cancer Neg Hx     Past Surgical History:  Procedure Laterality Date   FINGER SURGERY  LAPAROSCOPIC APPENDECTOMY N/A 04/03/2020   Procedure: APPENDECTOMY LAPAROSCOPIC;  Surgeon: Coralie Keens, MD;  Location: [facility];  Service: General;  Laterality: N/A;   Social History   Occupational History   Not on file  Tobacco Use   Smoking status: Every Day    Packs/day: 0.25    Types: Cigarettes   Smokeless tobacco: Never  Vaping Use   Vaping Use: Never used  Substance and Sexual Activity   Alcohol use: Yes    Comment: Occasionally    Drug use: Yes    Types: Marijuana    Comment: occasionally   Sexual activity: Yes    Birth control/protection: None

## 2021-08-27 ENCOUNTER — Telehealth (INDEPENDENT_AMBULATORY_CARE_PROVIDER_SITE_OTHER): Payer: Self-pay | Admitting: Primary Care

## 2021-08-27 LAB — SYNOVIAL FLUID ANALYSIS, COMPLETE
Basophils, %: 0 %
Eosinophils-Synovial: 0 % (ref 0–2)
Lymphocytes-Synovial Fld: 45 % (ref 0–74)
Monocyte/Macrophage: 44 % (ref 0–69)
Neutrophil, Synovial: 11 % (ref 0–24)
Synoviocytes, %: 0 % (ref 0–15)
WBC, Synovial: 304 cells/uL — ABNORMAL HIGH (ref <150)

## 2021-08-27 NOTE — Telephone Encounter (Signed)
Medication Refill - Medication: naproxen (NAPROSYN) 500 MG tablet [[id number]]  DISCONTINUED  Has the patient contacted their pharmacy? Yes.   (Agent: If no, request that the patient contact the pharmacy for the refill. If patient does not wish to contact the pharmacy document the reason why and proceed with request.) (Agent: If yes, when and what did the pharmacy advise?)  Preferred Pharmacy (with phone number or street name):  [facility] [address], [place] - [address]  [address] [place] [address]  Phone: [phone] Fax: [phone]  Hours: Not open 24 hours   Has the patient been seen for an appointment in the last year OR does the patient have an upcoming appointment? Yes.  02/26/21  Agent: Please be advised that RX refills may take up to 3 business days. We ask that you follow-up with your pharmacy.

## 2021-09-01 NOTE — Telephone Encounter (Signed)
Denied refilled followed by Dr. Roda Shutters orthopedic

## 2021-09-10 ENCOUNTER — Ambulatory Visit (INDEPENDENT_AMBULATORY_CARE_PROVIDER_SITE_OTHER): Payer: Self-pay

## 2021-09-10 NOTE — Telephone Encounter (Signed)
Advised patient of next available appt. Offered mobile bus or urgent care patient declined. Will purchase OTC naproxen and scheduled for 09/18/21.

## 2021-09-10 NOTE — Telephone Encounter (Signed)
  Chief Complaint: leg pain Symptoms: bilat LE pain from ankle to below knee, 7-8/10, burning sensation Frequency: 2 weeks  Pertinent Negatives: Patient denies swelling  Disposition: [] ED /[] Urgent Care (no appt availability in office) / [] Appointment(In office/virtual)/ []  [facility]/ [] Home Care/ [] Refused Recommended Disposition /[] [facility]/ []  Follow-up with PCP Additional Notes: pt states ever since she changed her diet her LE had been doing good but now started to drink sugary drinks and eating snacks again and inflammation has come back. Pt has taken Advil and slightly helps but pt was severe last night she had to leave work so pt is asking to see someone today. I advised her that I sent Temp, CMA a message via teams about pt coming in today or needing to fu with [facility]. Temp had to check with and d/t long wait advised pt I would have her call her back with advice on fu appt. Pt verbalized understanding. Pt was also asking about Naproxen refill and if she can get that for pain as well.   Reason for Disposition  [1] SEVERE pain (e.g., excruciating, unable to do any normal activities) AND [2] not improved after 2 hours of pain medicine  Answer Assessment - Initial Assessment Questions 1. ONSET: "When did the pain start?"      2 weeks ago  2. LOCATION: "Where is the pain located?"      Bilat lower LE  3. PAIN: "How bad is the pain?"    (Scale 1-10; or mild, moderate, severe)   -  MILD (1-3): doesn't interfere with normal activities    -  MODERATE (4-7): interferes with normal activities (e.g., work or school) or awakens from sleep, limping    -  SEVERE (8-10): excruciating pain, unable to do any normal activities, unable to walk     7/8, last night was 10+ 5. CAUSE: "What do you think is causing the leg pain?"     Inflammation  6. OTHER SYMPTOMS: "Do you have any other symptoms?" (e.g., chest pain, back pain, breathing difficulty, swelling, rash,  fever, numbness, weakness)     Pain causing anxiety, burning sensation  Protocols used: Leg Pain-A-AH

## 2021-09-18 ENCOUNTER — Ambulatory Visit (INDEPENDENT_AMBULATORY_CARE_PROVIDER_SITE_OTHER): Payer: Commercial Managed Care - HMO | Admitting: Primary Care

## 2021-09-18 ENCOUNTER — Encounter (INDEPENDENT_AMBULATORY_CARE_PROVIDER_SITE_OTHER): Payer: Self-pay | Admitting: Primary Care

## 2021-09-18 VITALS — BP 132/81 | HR 61 | Temp 98.0°F | Ht 67.0 in | Wt 207.0 lb

## 2021-09-18 DIAGNOSIS — F411 Generalized anxiety disorder: Secondary | ICD-10-CM

## 2021-09-18 MED ORDER — DULOXETINE HCL 30 MG PO CPEP: 30.0000 mg | ORAL_CAPSULE | Freq: Every day | ORAL | 3 refills | Status: DC

## 2021-09-18 NOTE — Patient Instructions (Signed)
Duloxetine Delayed-Release Capsules What is this medication? DULOXETINE (doo LOX e teen) treats depression, anxiety, fibromyalgia, and certain types of chronic pain such as nerve, bone, or joint pain. It increases the amount of serotonin and norepinephrine in the brain, hormones that help regulate mood and pain. It belongs to a group of medications called SNRIs. This medicine may be used for other purposes; ask your health care provider or pharmacist if you have questions. COMMON BRAND NAME(S): Cymbalta, Drizalma, Irenka What should I tell my care team before I take this medication? They need to know if you have any of these conditions: Bipolar disorder Glaucoma High blood pressure Kidney disease Liver disease Seizures Suicidal thoughts, plans or attempt; a previous suicide attempt by you or a family member Take medications that treat or prevent blood clots Taken medications called MAOIs like Carbex, Eldepryl, Marplan, Nardil, and Parnate within 14 days Trouble passing urine An unusual reaction to duloxetine, other medications, foods, dyes, or preservatives Pregnant or trying to get pregnant Breast-feeding How should I use this medication? Take this medication by mouth with a glass of water. Follow the directions on the prescription label. Do not crush, cut or chew some capsules of this medication. Some capsules may be opened and sprinkled on applesauce. Check with your care team or pharmacist if you are not sure. You can take this medication with or without food. Take your medication at regular intervals. Do not take your medication more often than directed. Do not stop taking this medication suddenly except upon the advice of your care team. Stopping this medication too quickly may cause serious side effects or your condition may worsen. A special MedGuide will be given to you by the pharmacist with each prescription and refill. Be sure to read this information carefully each time. Talk to  your care team regarding the use of this medication in children. While this medication may be prescribed for children as young as 7 years of age for selected conditions, precautions do apply. Overdosage: If you think you have taken too much of this medicine contact a poison control center or emergency room at once. NOTE: This medicine is only for you. Do not share this medicine with others. What if I miss a dose? If you miss a dose, take it as soon as you can. If it is almost time for your next dose, take only that dose. Do not take double or extra doses. What may interact with this medication? Do not take this medication with any of the following: Desvenlafaxine Levomilnacipran Linezolid MAOIs like Carbex, Eldepryl, Emsam, Marplan, Nardil, and Parnate Methylene blue (injected into a vein) Milnacipran Safinamide Thioridazine Venlafaxine Viloxazine This medication may also interact with the following: Alcohol Amphetamines Aspirin and aspirin-like medications Certain antibiotics like ciprofloxacin and enoxacin Certain medications for blood pressure, heart disease, irregular heart beat Certain medications for depression, anxiety, or psychotic disturbances Certain medications for migraine headache like almotriptan, eletriptan, frovatriptan, naratriptan, rizatriptan, sumatriptan, zolmitriptan Certain medications that treat or prevent blood clots like warfarin, enoxaparin, and dalteparin Cimetidine Fentanyl Lithium NSAIDS, medications for pain and inflammation, like ibuprofen or naproxen Phentermine Procarbazine Rasagiline Sibutramine St. John's wort Theophylline Tramadol Tryptophan This list may not describe all possible interactions. Give your health care provider a list of all the medicines, herbs, non-prescription drugs, or dietary supplements you use. Also tell them if you smoke, drink alcohol, or use illegal drugs. Some items may interact with your medicine. What should I watch  for while using this medication? Tell your   care team if your symptoms do not get better or if they get worse. Visit your care team for regular checks on your progress. Because it may take several weeks to see the full effects of this medication, it is important to continue your treatment as prescribed by your care team. This medication may cause serious skin reactions. They can happen weeks to months after starting the medication. Contact your care team right away if you notice fevers or flu-like symptoms with a rash. The rash may be red or purple and then turn into blisters or peeling of the skin. Or, you might notice a red rash with swelling of the face, lips, or lymph nodes in your neck or under your arms. Watch for new or worsening thoughts of suicide or depression. This includes sudden changes in mood, behaviors, or thoughts. These changes can happen at any time but are more common in the beginning of treatment or after a change in dose. Call your care team right away if you experience these thoughts or worsening depression. Manic episodes may happen in patients with bipolar disorder who take this medication. Watch for changes in feelings or behaviors such as feeling anxious, nervous, agitated, panicky, irritable, hostile, aggressive, impulsive, severely restless, overly excited and hyperactive, or trouble sleeping. These symptoms can happen at any time, but are more common in the beginning of treatment or after a change in dose. Call your care team right away if you notice any of these symptoms. You may get drowsy or dizzy. Do not drive, use machinery, or do anything that needs mental alertness until you know how this medication affects you. Do not stand or sit up quickly, especially if you are an older patient. This reduces the risk of dizzy or fainting spells. Alcohol may interfere with the effect of this medication. Avoid alcoholic drinks. This medication may increase blood sugar. The risk may be  higher in patients who already have diabetes. Ask your care team what you can do to lower your risk of diabetes while taking this medication. This medication can cause an increase in blood pressure. This medication can also cause a sudden drop in your blood pressure, which may make you feel faint and increase the chance of a fall. These effects are most common when you first start the medication or when the dose is increased, or during use of other medications that can cause a sudden drop in blood pressure. Check with your care team for instructions on monitoring your blood pressure while taking this medication. Your mouth may get dry. Chewing sugarless gum or sucking hard candy, and drinking plenty of water, may help. Contact your care team if the problem does not go away or is severe. What side effects may I notice from receiving this medication? Side effects that you should report to your care team as soon as possible: Allergic reactions--skin rash, itching, hives, swelling of the face, lips, tongue, or throat Bleeding--bloody or black, tar-like stools, red or dark brown urine, vomiting blood or brown material that looks like coffee grounds, small, red or purple spots on skin, unusual bleeding or bruising Increase in blood pressure Liver injury--right upper belly pain, loss of appetite, nausea, light-colored stool, dark yellow or brown urine, yellowing skin or eyes, unusual weakness or fatigue Low sodium level--muscle weakness, fatigue, dizziness, headache, confusion Redness, blistering, peeling, or loosening of the skin, including inside the mouth Serotonin syndrome--irritability, confusion, fast or irregular heartbeat, muscle stiffness, twitching muscles, sweating, high fever, seizures, chills, vomiting, diarrhea   Sudden eye pain or change in vision such as blurry vision, seeing halos around lights, vision loss Thoughts of suicide or self-harm, worsening mood, feelings of depression Trouble passing  urine Side effects that usually do not require medical attention (report to your care team if they continue or are bothersome): Change in sex drive or performance Constipation Diarrhea Dizziness Dry mouth Excessive sweating Loss of appetite Nausea Vomiting This list may not describe all possible side effects. Call your doctor for medical advice about side effects. You may report side effects to FDA at 1-800-FDA-1088. Where should I keep my medication? Keep out of the reach of children and pets. Store at room temperature between 15 and 30 degrees C (59 to 86 degrees F). Get rid of any unused medication after the expiration date. To get rid of medications that are no longer needed or have expired: Take the medication to a medication take-back program. Check with your pharmacy or law enforcement to find a location. If you cannot return the medication, check the label or package insert to see if the medication should be thrown out in the garbage or flushed down the toilet. If you are not sure, ask your care team. If it is safe to put it in the trash, take the medication out of the container. Mix the medication with cat litter, dirt, coffee grounds, or other unwanted substance. Seal the mixture in a bag or container. Put it in the trash. NOTE: This sheet is a summary. It may not cover all possible information. If you have questions about this medicine, talk to your doctor, pharmacist, or health care provider.  2023 Elsevier/Gold Standard (2020-01-22 00:00:00)  

## 2021-09-24 NOTE — Progress Notes (Signed)
.  [facility]ec  Established Patient Office Visit  Subjective   Patient ID: Rebecca Fields, female    DOB: Mar 12, 1970  Age: 51 y.o. MRN: [id number]  Chief Complaint  Patient presents with   inflammation of both legs    With burning. Causing her to have panic attacks     HPI   [name] Patient is a 51 year old obese female who presents  complaints of inflammation of both legs feels like they are burning and relating this to having panic attacks. Patient has No headache, No chest pain, No abdominal pain - No Nausea, No new weakness tingling or numbness, No Cough - shortness of breath  ROS  Comprehensive ROS Pertinent positive and negative noted in HPI   Objective:    BP 132/81   Pulse 61   Temp 98 F (36.7 C) (Oral)   Ht 5\' 7"  (1.702 m)   Wt 207 lb (93.9 kg)   LMP 09/08/2021 (Approximate)   SpO2 100%   BMI 32.42 kg/m  BP Readings from Last 3 Encounters:  09/18/21 132/81  07/14/21 (!) 151/96  07/13/21 (!) 140/100   Physical exam: General: Vital signs reviewed.  Patient is well-developed and well-nourished, obese female in no acute distress and cooperative with exam. Head: Normocephalic and atraumatic. Eyes: EOMI, conjunctivae normal, no scleral icterus. Neck: Supple, trachea midline, normal ROM, no JVD, masses, thyromegaly, or carotid bruit present. Cardiovascular: RRR, S1 normal, S2 normal, no murmurs, gallops, or rubs. Pulmonary/Chest: Clear to auscultation bilaterally, no wheezes, rales, or rhonchi. Abdominal: Soft, non-tender, non-distended, BS +, no masses, organomegaly, or guarding present. Musculoskeletal: No joint deformities, erythema, or stiffness, ROM full and nontender. Extremities: No lower extremity edema bilaterally,  pulses symmetric and intact bilaterally. No cyanosis or clubbing. Neurological: A&O x3, Strength is normal Skin: Warm, dry and intact. No rashes or erythema. Psychiatric: Normal mood and affect. speech and behavior is normal. Cognition and  memory are normal.  No results found for any visits on 09/18/21.  The 10-year ASCVD risk score (Arnett DK, et al., 2019) is: 4.5%    Assessment & Plan:  Rebecca Fields was seen today for inflammation of both legs.  Diagnoses and all orders for this visit:  Generalized anxiety disorder We discussed options for treatment of anxiety including therapy and/or medication. Will check basic labs to ensure thyroid is in normal range and that no other metabolic issues are obvious. She was agreeable with this plan.  -     DULoxetine (CYMBALTA) 30 MG capsule; Take 1 capsule (30 mg total) by mouth daily.  Return in about 7 weeks (around 11/06/2021) for medication f/u .    11/08/2021, NP

## 2021-10-06 ENCOUNTER — Ambulatory Visit (INDEPENDENT_AMBULATORY_CARE_PROVIDER_SITE_OTHER): Payer: Self-pay | Admitting: *Deleted

## 2021-10-06 NOTE — Telephone Encounter (Signed)
Summary: rx req / leg discomfort and nausea   The patient has recently been prescribed DULoxetine (CYMBALTA) 30 MG capsule [[id number]]  and shares that the medication has caused them nausea that persists throughout the morning and into the day   The patient shares that the medication also causes them to feel tired as well   The patient shares that the medication was originally prescribed for their leg discomfort which their continuing to experience   Please contact the patient further when possible   ----- Message from Leonia Reeves sent at 10/06/2021  4:31 PM EDT -----  The patient has recently been prescribed DULoxetine (CYMBALTA) 30 MG capsule [[id number]]  and shares that the medication has caused them nausea that persists throughout the morning and into the day   The patient shares that the medication also causes them to feel tired as well   The patient shares that the medication was originally prescribed for their leg discomfort which their continuing to experience   Please contact the patient further when possible        Chief Complaint: leg pain and nausea from medication Symptoms: nausea, tired feeling, sleepy after taking cymbalta and takes medication at night. C/o lightheadedness "the other night". Feels "pins and needles" in legs. Left leg worse than right. Reports wearing "sleeve" on leg at night for comfort. Requesting if should continue cymbalta and "2nd shot" not effective for knee pain . Frequency: since 09/16/21 Pertinent Negatives: Patient denies chest pain, no difficulty breathing. Disposition: [] ED /[] Urgent Care (no appt availability in office) / [] Appointment(In office/virtual)/ []  [facility]/ [] Home Care/ [] Refused Recommended Disposition /[] [facility]/ [x]  Follow-up with PCP Additional Notes:   Please advise if patient can get earlier appt than 10/26/21.          Reason for Disposition  [1] MODERATE pain (e.g., interferes with  normal activities, limping) AND [2] present > 3 days  Answer Assessment - Initial Assessment Questions 1. ONSET: "When did the pain start?"      Has been ongoing since OV 09/16/21 2. LOCATION: "Where is the pain located?"      Bilateral legs left knee 3. PAIN: "How bad is the pain?"    (Scale 1-10; or mild, moderate, severe)   -  MILD (1-3): doesn't interfere with normal activities    -  MODERATE (4-7): interferes with normal activities (e.g., work or school) or awakens from sleep, limping    -  SEVERE (8-10): excruciating pain, unable to do any normal activities, unable to walk     Pain with walking  4. WORK OR EXERCISE: "Has there been any recent work or exercise that involved this part of the body?"      na 5. CAUSE: "What do you think is causing the leg pain?"     Not  6. OTHER SYMPTOMS: "Do you have any other symptoms?" (e.g., chest pain, back pain, breathing difficulty, swelling, rash, fever, numbness, weakness)     Bilateral legs pins and needles at times, feels tired , nausea and sleepiness after taking cymbalta  7. PREGNANCY: "Is there any chance you are pregnant?" "When was your last menstrual period?"     na  Protocols used: Leg Pain-A-AH

## 2021-10-07 ENCOUNTER — Other Ambulatory Visit (INDEPENDENT_AMBULATORY_CARE_PROVIDER_SITE_OTHER): Payer: Self-pay | Admitting: Primary Care

## 2021-10-07 NOTE — Telephone Encounter (Signed)
Contacted pt to go over provider response pt is aware and doesn't have any questions or concerns  

## 2021-10-07 NOTE — Telephone Encounter (Signed)
Discontinue Cymbalta and schedule appt with your orthopedist Dr Roda Shutters for continue leg pain

## 2021-10-07 NOTE — Telephone Encounter (Signed)
Will forward to provider  

## 2021-10-13 ENCOUNTER — Ambulatory Visit (INDEPENDENT_AMBULATORY_CARE_PROVIDER_SITE_OTHER): Payer: Commercial Managed Care - HMO

## 2021-10-13 ENCOUNTER — Encounter: Payer: Self-pay | Admitting: Orthopaedic Surgery

## 2021-10-13 ENCOUNTER — Ambulatory Visit: Payer: Commercial Managed Care - HMO | Admitting: Orthopaedic Surgery

## 2021-10-13 ENCOUNTER — Other Ambulatory Visit: Payer: Self-pay

## 2021-10-13 DIAGNOSIS — M1712 Unilateral primary osteoarthritis, left knee: Secondary | ICD-10-CM | POA: Diagnosis not present

## 2021-10-13 NOTE — Progress Notes (Signed)
Office Visit Note   Patient: Rebecca Fields           Date of Birth: [birth year]           MRN: [id number] Visit Date: 10/13/2021              Requested by: Grayce Sessions, NP [address] [place],  [place] [address] PCP: Grayce Sessions, NP   Assessment & Plan: Visit Diagnoses:  1. Primary osteoarthritis of left knee     Plan: Impression is advanced left knee DJD.  Cortisone injection has not been effective.  After extensive conservative management and consideration of her options she has elected to move forward with a left total knee replacement.  Denies nickel allergy or history of DVT.  We will plan to live with daughter in [place] after surgery so we will need to help her with postoperative services down there.  Follow-Up Instructions: No follow-ups on file.   Orders:  Orders Placed This Encounter  Procedures   XR KNEE 3 VIEW LEFT   No orders of the defined types were placed in this encounter.     Procedures: No procedures performed   Clinical Data: No additional findings.   Subjective: Chief Complaint  Patient presents with   Left Knee - Pain    HPI Rebecca Fields returns today for chronic left knee pain.  Cortisone injection 8-23 has already worn off.  She is unable to do her job at [employer].  She is seriously considering having a knee replacement due to severe pain.  Review of Systems  Constitutional: Negative.   HENT: Negative.    Eyes: Negative.   Respiratory: Negative.    Cardiovascular: Negative.   Endocrine: Negative.   Musculoskeletal: Negative.   Neurological: Negative.   Hematological: Negative.   Psychiatric/Behavioral: Negative.    All other systems reviewed and are negative.    Objective: Vital Signs: LMP 09/08/2021 (Approximate)   Physical Exam Vitals and nursing note reviewed.  Constitutional:      Appearance: She is well-developed.  HENT:     Head: Normocephalic and atraumatic.  Pulmonary:     Effort: Pulmonary effort  is normal.  Abdominal:     Palpations: Abdomen is soft.  Musculoskeletal:     Cervical back: Neck supple.  Skin:    General: Skin is warm.     Capillary Refill: Capillary refill takes less than 2 seconds.  Neurological:     Mental Status: She is alert and oriented to person, place, and time.  Psychiatric:        Behavior: Behavior normal.        Thought Content: Thought content normal.        Judgment: Judgment normal.     Ortho Exam Lamination of left knee shows pain and crepitus with flexion past 70 degrees.  Collaterals and cruciates are stable.  Medial and lateral joint line tenderness. Specialty Comments:  No specialty comments available.  Imaging: XR KNEE 3 VIEW LEFT  Result Date: 10/13/2021 Advanced tricompartmental degenerative joint disease.  Bone-on-bone joint space narrowing.    PMFS History: Patient Active Problem List   Diagnosis Date Noted   Primary osteoarthritis of left knee 08/26/2021   Acute appendicitis 04/03/2020   Past Medical History:  Diagnosis Date   Gastric ulcer     Family History  Problem Relation Age of Onset   Breast cancer Neg Hx     Past Surgical History:  Procedure Laterality Date   FINGER SURGERY  LAPAROSCOPIC APPENDECTOMY N/A 04/03/2020   Procedure: APPENDECTOMY LAPAROSCOPIC;  Surgeon: Coralie Keens, MD;  Location: [facility];  Service: General;  Laterality: N/A;   Social History   Occupational History   Not on file  Tobacco Use   Smoking status: Every Day    Packs/day: 0.25    Types: Cigarettes   Smokeless tobacco: Never  Vaping Use   Vaping Use: Never used  Substance and Sexual Activity   Alcohol use: Yes    Comment: Occasionally    Drug use: Yes    Types: Marijuana    Comment: occasionally   Sexual activity: Yes    Birth control/protection: None

## 2021-10-14 ENCOUNTER — Telehealth: Payer: Self-pay | Admitting: Orthopaedic Surgery

## 2021-10-14 NOTE — Telephone Encounter (Signed)
See message.  Patient wants to wait on surgery

## 2021-10-14 NOTE — Telephone Encounter (Signed)
Patient called. Wants to wait on surgery

## 2021-10-21 ENCOUNTER — Ambulatory Visit: Payer: Commercial Managed Care - HMO | Admitting: Orthopaedic Surgery

## 2021-10-21 ENCOUNTER — Encounter: Payer: Self-pay | Admitting: Orthopaedic Surgery

## 2021-10-21 DIAGNOSIS — M1712 Unilateral primary osteoarthritis, left knee: Secondary | ICD-10-CM | POA: Diagnosis not present

## 2021-10-21 MED ORDER — BUPIVACAINE HCL 0.5 % IJ SOLN
2.0000 mL | INTRAMUSCULAR | Status: AC | PRN
  Administered 2021-10-21: 2 mL via INTRA_ARTICULAR

## 2021-10-21 MED ORDER — LIDOCAINE HCL 1 % IJ SOLN
2.0000 mL | INTRAMUSCULAR | Status: AC | PRN
  Administered 2021-10-21: 2 mL

## 2021-10-21 NOTE — Progress Notes (Signed)
Office Visit Note   Patient: Rebecca Fields           Date of Birth: 04-19-1970           MRN: [id number] Visit Date: 10/21/2021              Requested by: Kerin Perna, NP [address] [place],  [place] [address] PCP: Kerin Perna, NP   Assessment & Plan: Visit Diagnoses:  1. Unilateral primary osteoarthritis, left knee     Plan: Impression is left knee osteoarthritis.  Today, I discussed with the patient that it is too early to repeat cortisone injection.  I have discussed that we could proceed with intra-articular Toradol injection for which she is agreeable to.  She will follow-up with [place] as needed.  Follow-Up Instructions: Return if symptoms worsen or fail to improve.   Orders:  No orders of the defined types were placed in this encounter.  No orders of the defined types were placed in this encounter.     Procedures: Large Joint Inj: L knee on 10/21/2021 8:29 PM Details: 22 G needle Medications: 2 mL bupivacaine 0.5 %; 2 mL lidocaine 1 % Outcome: tolerated well, no immediate complications Patient was prepped and draped in the usual sterile fashion.       Clinical Data: No additional findings.   Subjective: Chief Complaint  Patient presents with   Left Knee - Pain    HPI patient is a pleasant 51 year old female who comes in today with recurrent left knee pain.  The pain she has is to the entire knee and is constant.  She does have increased pain with flexion as well as getting out of the bed and out of her car.  She has been using a sleeve at night and elevating her leg and taking Tylenol which slightly helps.  History of advanced arthritis.  She recently underwent cortisone injection in August which provided temporary relief.  She is scheduled for left total knee replacement this January with Dr.Mayleigh Tetrault.   Review of Systems as detailed in HPI.  All others reviewed and are negative.   Objective: Vital Signs: There were no vitals taken for this  visit.  Physical Exam well-developed well-nourished female no acute distress.  Alert and oriented x3.  Ortho Exam left knee exam shows a small effusion.  Range of motion 0 to 90 degrees.  Medial joint line tenderness.  She is neurovascular tact distally.  Specialty Comments:  No specialty comments available.  Imaging: No new imaging   PMFS History: Patient Active Problem List   Diagnosis Date Noted   Primary osteoarthritis of left knee 08/26/2021   Acute appendicitis 04/03/2020   Past Medical History:  Diagnosis Date   Gastric ulcer     Family History  Problem Relation Age of Onset   Breast cancer Neg Hx     Past Surgical History:  Procedure Laterality Date   FINGER SURGERY     LAPAROSCOPIC APPENDECTOMY N/A 04/03/2020   Procedure: APPENDECTOMY LAPAROSCOPIC;  Surgeon: Coralie Keens, MD;  Location: [facility];  Service: General;  Laterality: N/A;   Social History   Occupational History   Not on file  Tobacco Use   Smoking status: Every Day    Packs/day: 0.25    Types: Cigarettes   Smokeless tobacco: Never  Vaping Use   Vaping Use: Never used  Substance and Sexual Activity   Alcohol use: Yes    Comment: Occasionally    Drug use: Yes  Types: Marijuana    Comment: occasionally   Sexual activity: Yes    Birth control/protection: None

## 2021-11-06 ENCOUNTER — Ambulatory Visit (INDEPENDENT_AMBULATORY_CARE_PROVIDER_SITE_OTHER): Payer: Commercial Managed Care - HMO | Admitting: Primary Care

## 2021-11-24 ENCOUNTER — Ambulatory Visit: Payer: Commercial Managed Care - HMO | Admitting: Orthopaedic Surgery

## 2021-11-24 ENCOUNTER — Encounter: Payer: Self-pay | Admitting: Orthopaedic Surgery

## 2021-11-24 DIAGNOSIS — M1712 Unilateral primary osteoarthritis, left knee: Secondary | ICD-10-CM | POA: Diagnosis not present

## 2021-11-24 NOTE — Progress Notes (Signed)
   Office Visit Note   Patient: Rebecca Fields           Date of Birth: November 12, 1970           MRN: [id number] Visit Date: 11/24/2021              Requested by: Kerin Perna, NP [address] [place],  [place] [address] PCP: Kerin Perna, NP   Assessment & Plan: Visit Diagnoses:  1. Primary osteoarthritis of left knee     Plan: Ms. Alcario Drought comes back today for left knee symptoms.  She states that the pain is causing her anxiety to be worse.  She feels some occasional shocking pain.  Also feeling more pain from the Baker's cyst.  Examination of the left knee is unchanged.  Trace effusion of the knee joint.  There is small palpable Baker's cyst.  Again we had a long discussion disease process and treatment options.  She does not want to move up her knee replacement to keep it from January.  I do not recommend doing any injections or aspirations from the surgery.  She will continue symptomatic treatment.  Questions encouraged and answered.  Follow-Up Instructions: No follow-ups on file.   Orders:  No orders of the defined types were placed in this encounter.  No orders of the defined types were placed in this encounter.     Procedures: No procedures performed   Clinical Data: No additional findings.   Subjective: Chief Complaint  Patient presents with   Left Knee - Pain    HPI  Review of Systems   Objective: Vital Signs: There were no vitals taken for this visit.  Physical Exam  Ortho Exam  Specialty Comments:  No specialty comments available.  Imaging: No results found.   PMFS History: Patient Active Problem List   Diagnosis Date Noted   Primary osteoarthritis of left knee 08/26/2021   Acute appendicitis 04/03/2020   Past Medical History:  Diagnosis Date   Gastric ulcer     Family History  Problem Relation Age of Onset   Breast cancer Neg Hx     Past Surgical History:  Procedure Laterality Date   FINGER SURGERY      LAPAROSCOPIC APPENDECTOMY N/A 04/03/2020   Procedure: APPENDECTOMY LAPAROSCOPIC;  Surgeon: Coralie Keens, MD;  Location: [facility];  Service: General;  Laterality: N/A;   Social History   Occupational History   Not on file  Tobacco Use   Smoking status: Every Day    Packs/day: 0.25    Types: Cigarettes   Smokeless tobacco: Never  Vaping Use   Vaping Use: Never used  Substance and Sexual Activity   Alcohol use: Yes    Comment: Occasionally    Drug use: Yes    Types: Marijuana    Comment: occasionally   Sexual activity: Yes    Birth control/protection: None

## 2021-11-25 ENCOUNTER — Telehealth (INDEPENDENT_AMBULATORY_CARE_PROVIDER_SITE_OTHER): Payer: Self-pay | Admitting: Primary Care

## 2021-11-25 ENCOUNTER — Ambulatory Visit (INDEPENDENT_AMBULATORY_CARE_PROVIDER_SITE_OTHER): Payer: Self-pay | Admitting: *Deleted

## 2021-11-25 NOTE — Telephone Encounter (Signed)
Chief Complaint: anxiety worsening due to worsening leg pain Symptoms: left leg pain worse, edema, radiates to both legs. Surgery scheduled Jan 8. Dr reports he will not give additional "shots" or pain medication. Patient taking advil with no relief. Took body detox Saturday and feels worse. Use to make legs feel better now not working. Trouble sleeping , only sleeps a few hours and wakes up. Anxiety comes and goes and causes her to want to get out of her housed and drive around at night. Chest discomfort at times lump in throat, but not now. Changed diet to decrease pain in legs and legs still hurt. No c/o hurting self or other.  Frequency: past month  Pertinent Negatives: Patient denies chest pain no difficulty breathing no thoughts of suicide. Disposition: [] ED /[] Urgent Care (no appt availability in office) / [x] Appointment(In office/virtual)/ []  [facility]/ [] Home Care/ [] Refused Recommended Disposition /[] [facility]/ []  Follow-up with PCP Additional Notes:   Requesting medication for pain and anxiety . Appt scheduled for 11/27/21. Recommended to call or go to Urgent crisis center prior to [facility] if needed gave # [phone]. Recommended if sx worsen go to ED.  Please advise    Reason for Disposition  MODERATE anxiety (e.g., persistent or frequent anxiety symptoms; interferes with sleep, school, or work)  Answer Assessment - Initial Assessment Questions 1. CONCERN: "Did anything happen that prompted you to call today?"      Leg pain worsening causing worsening anxiety  2. ANXIETY SYMPTOMS: "Can you describe how you (your loved one; patient) have been feeling?" (e.g., tense, restless, panicky, anxious, keyed up, overwhelmed, sense of impending doom).      Anxiety, feels like "something is going to happen" due to living alone  3. ONSET: "How long have you been feeling this way?" (e.g., hours, days, weeks)     A few weeks - last month 4. SEVERITY: "How would you rate the  level of anxiety?" (e.g., 0 - 10; or mild, moderate, severe).     Sometimes worse than others. Patient feels she has to get out of house in the middle of the night and drive around 5. FUNCTIONAL IMPAIRMENT: "How have these feelings affected your ability to do daily activities?" "Have you had more difficulty than usual doing your normal daily activities?" (e.g., getting better, same, worse; self-care, school, work, interactions)     Can do ADLs 6. HISTORY: "Have you felt this way before?" "Have you ever been diagnosed with an anxiety problem in the past?" (e.g., generalized anxiety disorder, panic attacks, PTSD). If Yes, ask: "How was this problem treated?" (e.g., medicines, counseling, etc.)     Yes  7. RISK OF HARM - SUICIDAL IDEATION: "Do you ever have thoughts of hurting or killing yourself?" If Yes, ask:  "Do you have these feelings now?" "Do you have a plan on how you would do this?"     no 8. TREATMENT:  "What has been done so far to treat this anxiety?" (e.g., medicines, relaxation strategies). "What has helped?"     prayer 9. TREATMENT - THERAPIST: "Do you have a counselor or therapist? Name?"     Na  10. POTENTIAL TRIGGERS: "Do you drink caffeinated beverages (e.g., coffee, colas, teas), and how much daily?" "Do you drink alcohol or use any drugs?" "Have you started any new medicines recently?"       Changed diet and has been drinking coffee but does not feel excessive  11. PATIENT SUPPORT: "Who is with you now?" "  Who do you live with?" "Do you have family or friends who you can talk to?"       Lives alone can call son  30. OTHER SYMPTOMS: "Do you have any other symptoms?" (e.g., feeling depressed, trouble concentrating, trouble sleeping, trouble breathing, palpitations or fast heartbeat, chest pain, sweating, nausea, or diarrhea)       Chest discomfort at times feels like "lump" in throat and chest. Nausea. Sweating but not now. Trouble sleeping. Every few hours wakes up. Leg pain from one  leg to the other. Feels like anxiety attacks at times  13. PREGNANCY: "Is there any chance you are pregnant?" "When was your last menstrual period?"       na  Protocols used: Anxiety and Panic Attack-A-AH

## 2021-11-25 NOTE — Telephone Encounter (Signed)
Medication Refill - Medication: naproxen (NAPROSYN) 500 MG tablet [[id number]]     Has the patient contacted their pharmacy? No. Rx was discontinued and  (Agent: If no, request that the patient contact the pharmacy for the refill. If patient does not wish to contact the pharmacy document the reason why and proceed with request.) (Agent: If yes, when and what did the pharmacy advise?)  Preferred Pharmacy (with phone number or street name):  [facility] [place]. Phone:  [phone]  Fax:  [phone]     Has the patient been seen for an appointment in the last year OR does the patient have an upcoming appointment? Yes.    Agent: Please be advised that RX refills may take up to 3 business days. We ask that you follow-up with your pharmacy.

## 2021-11-25 NOTE — Telephone Encounter (Signed)
Will forward to provider  

## 2021-11-25 NOTE — Telephone Encounter (Signed)
Medication not on current list  

## 2021-11-26 ENCOUNTER — Encounter (INDEPENDENT_AMBULATORY_CARE_PROVIDER_SITE_OTHER): Payer: Self-pay | Admitting: Primary Care

## 2021-11-26 ENCOUNTER — Telehealth: Payer: Self-pay | Admitting: Orthopaedic Surgery

## 2021-11-26 ENCOUNTER — Ambulatory Visit (INDEPENDENT_AMBULATORY_CARE_PROVIDER_SITE_OTHER): Payer: Commercial Managed Care - HMO | Admitting: Primary Care

## 2021-11-26 VITALS — BP 154/95 | HR 75 | Resp 16 | Wt 195.4 lb

## 2021-11-26 DIAGNOSIS — M238X2 Other internal derangements of left knee: Secondary | ICD-10-CM

## 2021-11-26 DIAGNOSIS — E6609 Other obesity due to excess calories: Secondary | ICD-10-CM | POA: Diagnosis not present

## 2021-11-26 DIAGNOSIS — Z6830 Body mass index (BMI) 30.0-30.9, adult: Secondary | ICD-10-CM | POA: Diagnosis not present

## 2021-11-26 DIAGNOSIS — F4323 Adjustment disorder with mixed anxiety and depressed mood: Secondary | ICD-10-CM | POA: Diagnosis not present

## 2021-11-26 MED ORDER — FLUOXETINE HCL 10 MG PO CAPS: 10.0000 mg | ORAL_CAPSULE | Freq: Every day | ORAL | 3 refills | Status: AC

## 2021-11-26 NOTE — Progress Notes (Signed)
       [facility]   Subjective:     Rebecca Fields is a 51 y.o. female who presents for new evaluation and treatment of anxiety disorder. She has the following anxiety symptoms: chest pain, difficulty concentrating, dizziness, fatigue, feelings of losing control, insomnia, irritable, palpitations, and shortness of breath. Onset of symptoms was approximately 2 weeks ago. Symptoms have been rapidly worsening since that time. She denies current suicidal and homicidal ideation. Family history significant for no psychiatric illness. Risk factors: none. Previous treatment includes  none . Marland Kitchen Left leg has fluid behind her knee. Patient has No headache, No chest pain, No abdominal pain - No Nausea, No new weakness tingling or numbness, No Cough - shortness of breath  The following portions of the patient's history were reviewed and updated as appropriate: allergies, current medications, past family history, past medical history, past social history, and past surgical history.  Review of Systems Pertinent items noted in HPI and remainder of comprehensive ROS otherwise negative.    Objective:     BP (!) 154/95   Pulse 75   Resp 16   Wt 195 lb 6.4 oz (88.6 kg)   SpO2 99%   BMI 30.60 kg/m  General appearance: alert, cooperative, and mildly obese Head: Normocephalic, without obvious abnormality, atraumatic Eyes: conjunctivae/corneas clear. PERRL, EOM's intact. Fundi benign. Neck: no adenopathy, no carotid bruit, no JVD, supple, symmetrical, trachea midline, and thyroid not enlarged, symmetric, no tenderness/mass/nodules Back: symmetric, no curvature. ROM normal. No CVA tenderness. Lungs: clear to auscultation bilaterally Abdomen: soft, non-tender; bowel sounds normal; no masses,  no organomegaly Extremities: extremities normal, atraumatic, no cyanosis or edema Skin: Skin color, texture, turgor normal. No rashes or lesions or fluid filled mass behind left knee Neurologic: Alert  and oriented X 3, normal strength and tone. Normal symmetric reflexes. Normal coordination and gait    Assessment:  Rebecca Fields was seen today for anxiety and leg pain.  Diagnoses and all orders for this visit:  Adjustment disorder with mixed anxiety and depressed mood -     FLUoxetine (PROZAC) 10 MG capsule; Take 1 capsule (10 mg total) by mouth daily.   Crepitus of joint of left knee  fluid behind her knee- refer back to ortho  Class 1 obesity due to excess calories with body mass index (BMI) of 30.0 to 30.9 in adult, unspecified whether serious comorbidity present Obesity is 30-39 indicating an excess in caloric intake or underlining conditions. This may lead to other co-morbidities. Educated on lifestyle modifications of diet and exercise which may reduce obesity.    This note has been created with Surveyor, quantity. Any transcriptional errors are unintentional.   Kerin Perna, NP 11/26/2021, 4:18 PM

## 2021-11-26 NOTE — Patient Instructions (Signed)
Fluoxetine Capsules or Tablets (Depression/Mood Disorders) What is this medication? FLUOXETINE (floo OX e teen) treats depression, anxiety, obsessive-compulsive disorder (OCD), and eating disorders. It increases the amount of serotonin in the brain, a hormone that helps regulate mood. It belongs to a group of medications called SSRIs. This medicine may be used for other purposes; ask your health care provider or pharmacist if you have questions. COMMON BRAND NAME(S): Prozac What should I tell my care team before I take this medication? They need to know if you have any of these conditions: Bipolar disorder or a family history of bipolar disorder Bleeding disorders Glaucoma Heart disease Liver disease Low levels of sodium in the blood Seizures Suicidal thoughts, plans, or attempt; a previous suicide attempt by you or a family member Take MAOIs like Carbex, Eldepryl, Marplan, Nardil, and Parnate Take medications that treat or prevent blood clots Thyroid disease An unusual or allergic reaction to fluoxetine, other medications, foods, dyes, or preservatives Pregnant or trying to get pregnant Breast-feeding How should I use this medication? Take this medication by mouth with a glass of water. Follow the directions on the prescription label. You can take this medication with or without food. Take your medication at regular intervals. Do not take it more often than directed. Do not stop taking this medication suddenly except upon the advice of your care team. Stopping this medication too quickly may cause serious side effects or your condition may worsen. A special MedGuide will be given to you by the pharmacist with each prescription and refill. Be sure to read this information carefully each time. Talk to your care team about the use of this medication in children. While it may be prescribed for children as young as 7 years for selected conditions, precautions do apply. Overdosage: If you think  you have taken too much of this medicine contact a poison control center or emergency room at once. NOTE: This medicine is only for you. Do not share this medicine with others. What if I miss a dose? If you miss a dose, skip the missed dose and go back to your regular dosing schedule. Do not take double or extra doses. What may interact with this medication? Do not take this medication with any of the following: Other medications containing fluoxetine, like Sarafem or Symbyax Cisapride Dronedarone Linezolid MAOIs like Carbex, Eldepryl, Marplan, Nardil, and Parnate Methylene blue (injected into a vein) Pimozide Thioridazine This medication may also interact with the following: Alcohol Amphetamines Aspirin and aspirin-like medications Carbamazepine Certain medications for depression, anxiety, or psychotic disturbances Certain medications for migraine headaches like almotriptan, eletriptan, frovatriptan, naratriptan, rizatriptan, sumatriptan, zolmitriptan Digoxin Diuretics Fentanyl Flecainide Furazolidone Isoniazid Lithium Medications for sleep Medications that treat or prevent blood clots like warfarin, enoxaparin, and dalteparin NSAIDs, medications for pain and inflammation, like ibuprofen or naproxen Other medications that prolong the QT interval (an abnormal heart rhythm) Phenytoin Procarbazine Propafenone Rasagiline Ritonavir Supplements like St. John's wort, kava kava, valerian Tramadol Tryptophan Vinblastine This list may not describe all possible interactions. Give your health care provider a list of all the medicines, herbs, non-prescription drugs, or dietary supplements you use. Also tell them if you smoke, drink alcohol, or use illegal drugs. Some items may interact with your medicine. What should I watch for while using this medication? Tell your care team if your symptoms do not get better or if they get worse. Visit your care team for regular checks on your  progress. Because it may take several weeks to see the   full effects of this medication, it is important to continue your treatment as prescribed. Watch for new or worsening thoughts of suicide or depression. This includes sudden changes in mood, behavior, or thoughts. These changes can happen at any time but are more common in the beginning of treatment or after a change in dose. Call your care team right away if you experience these thoughts or worsening depression. Manic episodes may happen in patients with bipolar disorder who take this medication. Watch for changes in feelings or behaviors such as feeling anxious, nervous, agitated, panicky, irritable, hostile, aggressive, impulsive, severely restless, overly excited and hyperactive, or trouble sleeping. These symptoms can happen at anytime but are more common in the beginning of treatment or after a change in dose. Call your care team right away if you notice any of these symptoms. You may get drowsy or dizzy. Do not drive, use machinery, or do anything that needs mental alertness until you know how this medication affects you. Do not stand or sit up quickly, especially if you are an older patient. This reduces the risk of dizzy or fainting spells. Alcohol may interfere with the effect of this medication. Avoid alcoholic drinks. Your mouth may get dry. Chewing sugarless gum or sucking hard candy, and drinking plenty of water may help. Contact your care team if the problem does not go away or is severe. This medication may affect blood sugar levels. If you have diabetes, check with your care team before you make changes to your diet or medications. What side effects may I notice from receiving this medication? Side effects that you should report to your care team as soon as possible: Allergic reactions--skin rash, itching, hives, swelling of the face, lips, tongue, or throat Bleeding--bloody or black, tar-like stools, red or dark brown urine, vomiting  blood or brown material that looks like coffee grounds, small, red or purple spots on skin, unusual bleeding or bruising Heart rhythm changes--fast or irregular heartbeat, dizziness, feeling faint or lightheaded, chest pain, trouble breathing Loss of appetite with weight loss Low sodium level--muscle weakness, fatigue, dizziness, headache, confusion Serotonin syndrome--irritability, confusion, fast or irregular heartbeat, muscle stiffness, twitching muscles, sweating, high fever, seizure, chills, vomiting, diarrhea Sudden eye pain or change in vision such as blurry vision, seeing halos around lights, vision loss Thoughts of suicide or self-harm, worsening mood, feelings of depression Side effects that usually do not require medical attention (report to your care team if they continue or are bothersome): Anxiety, nervousness Change in sex drive or performance Diarrhea Dry mouth Headache Excessive sweating Nausea Tremors or shaking Trouble sleeping Upset stomach This list may not describe all possible side effects. Call your doctor for medical advice about side effects. You may report side effects to FDA at 1-800-FDA-1088. Where should I keep my medication? Keep out of the reach of children and pets. Store at room temperature between 15 and 30 degrees C (59 and 86 degrees F). Get rid of any unused medication after the expiration date. NOTE: This sheet is a summary. It may not cover all possible information. If you have questions about this medicine, talk to your doctor, pharmacist, or health care provider.  2023 [employer]vier/Gold Standard (2020-01-14 00:00:00)  

## 2021-11-26 NOTE — Telephone Encounter (Signed)
Patient called advised Dr. Juluis Mire advised her she could no longer fill the Rx Naproxen. Patient said she was advised Dr. Erlinda Hong would have to prescribe the medication moving forward. The number to contact patient is [phone]

## 2021-11-26 NOTE — Telephone Encounter (Signed)
Contacted pt to go over provider response pt is aware

## 2021-11-26 NOTE — Telephone Encounter (Signed)
I am happy to send it in unless there is a medical reason that provider will not write?  Sometimes it may be due to blood pressure or renal issues.  Will you ask her and let me know?

## 2021-11-27 ENCOUNTER — Other Ambulatory Visit: Payer: Self-pay | Admitting: Physician Assistant

## 2021-11-27 MED ORDER — NAPROXEN SODIUM 550 MG PO TABS: ORAL_TABLET | ORAL | 2 refills | Status: AC

## 2021-11-27 NOTE — Telephone Encounter (Signed)
Ok, sounds good.  Just sent to [facility]

## 2021-11-30 ENCOUNTER — Encounter (INDEPENDENT_AMBULATORY_CARE_PROVIDER_SITE_OTHER): Payer: Self-pay | Admitting: Primary Care

## 2021-12-08 ENCOUNTER — Telehealth (INDEPENDENT_AMBULATORY_CARE_PROVIDER_SITE_OTHER): Payer: Self-pay | Admitting: Licensed Clinical Social Worker

## 2021-12-08 NOTE — Telephone Encounter (Signed)
LCSWA called patient today to introduce herself and to assess patients' mental health needs. Patient was referred by PCP for anxiety. When she talks about certain things she feels like it brings on her anxiety. Her living situation, having to leave with her son. Was prescribed mediation that she takes as needed. She stated that she is started to feel better the last 2 weeks. She is getting better sleep than before. However, has heard horrible things about the medication and does not want to take it daily as prescribed she takes it as needed. Patient needed to rush off the phone due to work. She stated she would call back.

## 2022-01-07 ENCOUNTER — Ambulatory Visit: Payer: Commercial Managed Care - HMO | Admitting: Orthopaedic Surgery

## 2022-01-14 ENCOUNTER — Ambulatory Visit: Payer: Commercial Managed Care - HMO | Admitting: Orthopaedic Surgery

## 2022-01-14 DIAGNOSIS — M1712 Unilateral primary osteoarthritis, left knee: Secondary | ICD-10-CM

## 2022-01-14 NOTE — Progress Notes (Signed)
   Office Visit Note   Patient: Rebecca Fields           Date of Birth: 31-Dec-1970           MRN: [id number] Visit Date: 01/14/2022              Requested by: Grayce Sessions, NP [address] [place],  [place] [address] PCP: Grayce Sessions, NP   Assessment & Plan: Visit Diagnoses:  1. Primary osteoarthritis of left knee     Plan: Impression is advanced left knee DJD.  Cortisone injection performed today.  She tolerated this well.  Will see her back as needed.  She will let us know when she is ready to get back on the surgery schedule.  Follow-Up Instructions: No follow-ups on file.   Orders:  No orders of the defined types were placed in this encounter.  No orders of the defined types were placed in this encounter.     Procedures: No procedures performed   Clinical Data: No additional findings.   Subjective: Chief Complaint  Patient presents with   Left Knee - Pain    HPI Rebecca Fields returns today for follow-up of the left knee DJD.  She would like an injection today.  She has decided to postpone the surgery due to lack of social support.  Her daughter is all lives in [place].  Review of Systems   Objective: Vital Signs: There were no vitals taken for this visit.  Physical Exam  Ortho Exam Examination left knee is unchanged. Specialty Comments:  No specialty comments available.  Imaging: No results found.   PMFS History: Patient Active Problem List   Diagnosis Date Noted   Primary osteoarthritis of left knee 08/26/2021   Acute appendicitis 04/03/2020   Past Medical History:  Diagnosis Date   Gastric ulcer     Family History  Problem Relation Age of Onset   Breast cancer Neg Hx     Past Surgical History:  Procedure Laterality Date   FINGER SURGERY     LAPAROSCOPIC APPENDECTOMY N/A 04/03/2020   Procedure: APPENDECTOMY LAPAROSCOPIC;  Surgeon: Abigail Miyamoto, MD;  Location: [facility] OR;  Service: General;  Laterality: N/A;    Social History   Occupational History   Not on file  Tobacco Use   Smoking status: Every Day    Packs/day: 0.25    Types: Cigarettes   Smokeless tobacco: Never  Vaping Use   Vaping Use: Never used  Substance and Sexual Activity   Alcohol use: Yes    Comment: Occasionally    Drug use: Yes    Types: Marijuana    Comment: occasionally   Sexual activity: Yes    Birth control/protection: None

## 2022-01-20 ENCOUNTER — Other Ambulatory Visit (HOSPITAL_COMMUNITY): Payer: Commercial Managed Care - HMO

## 2022-01-26 ENCOUNTER — Encounter (INDEPENDENT_AMBULATORY_CARE_PROVIDER_SITE_OTHER): Payer: Self-pay | Admitting: Primary Care

## 2022-02-01 ENCOUNTER — Ambulatory Visit: Admit: 2022-02-01 | Payer: Commercial Managed Care - HMO | Admitting: Orthopaedic Surgery

## 2022-02-01 ENCOUNTER — Telehealth: Payer: Commercial Managed Care - HMO | Admitting: Emergency Medicine

## 2022-02-01 DIAGNOSIS — R52 Pain, unspecified: Secondary | ICD-10-CM

## 2022-02-01 DIAGNOSIS — M1712 Unilateral primary osteoarthritis, left knee: Secondary | ICD-10-CM

## 2022-02-01 SURGERY — ARTHROPLASTY, KNEE, TOTAL
Anesthesia: Spinal | Site: Knee | Laterality: Left

## 2022-02-01 NOTE — Progress Notes (Signed)
Pt is located in [place] right now. I am not able to see her as I am not licensed in that state. Directed her to [facility] for virtual care out of state/.

## 2022-02-16 ENCOUNTER — Encounter: Payer: Commercial Managed Care - HMO | Admitting: Orthopaedic Surgery

## 2022-02-17 ENCOUNTER — Telehealth: Payer: Self-pay | Admitting: Orthopaedic Surgery

## 2022-02-17 NOTE — Telephone Encounter (Signed)
Received vm from patient needing to get a copy of medical records. IC,lmvm advised needs to come in to complete authorization for records. Upon receipt of auth, will call when ready. Pts ph [phone]

## 2022-02-26 ENCOUNTER — Telehealth: Payer: Self-pay | Admitting: Orthopaedic Surgery

## 2022-02-26 NOTE — Telephone Encounter (Signed)
Received call from patient, she currently at doctor appt and needs medical records faxed. I spoke with staff there at Dr. Rodman Key office. Records 08/26/2021-present faxed  [phone].

## 2022-04-07 ENCOUNTER — Encounter (INDEPENDENT_AMBULATORY_CARE_PROVIDER_SITE_OTHER): Payer: Self-pay

## 2023-08-27 IMAGING — DX DG TIBIA/FIBULA 2V*L*
3 series · 3 of 3 positions shown · non-contrast
Comparison: None Available.

CLINICAL DATA: Blunt trauma.

EXAM:
LEFT TIBIA AND FIBULA - 2 VIEW

[tibia ap]
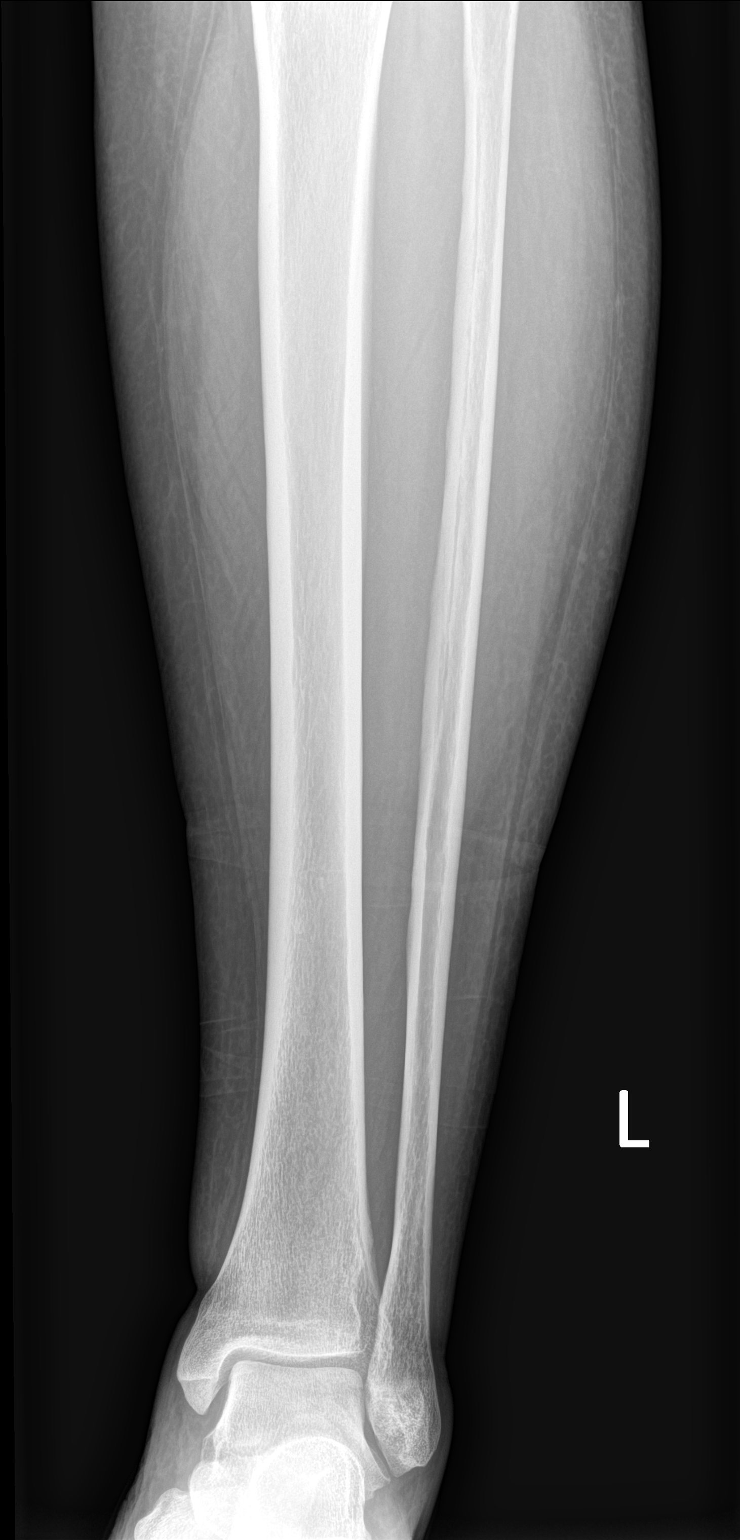

[tibia lat (1 of 2)]
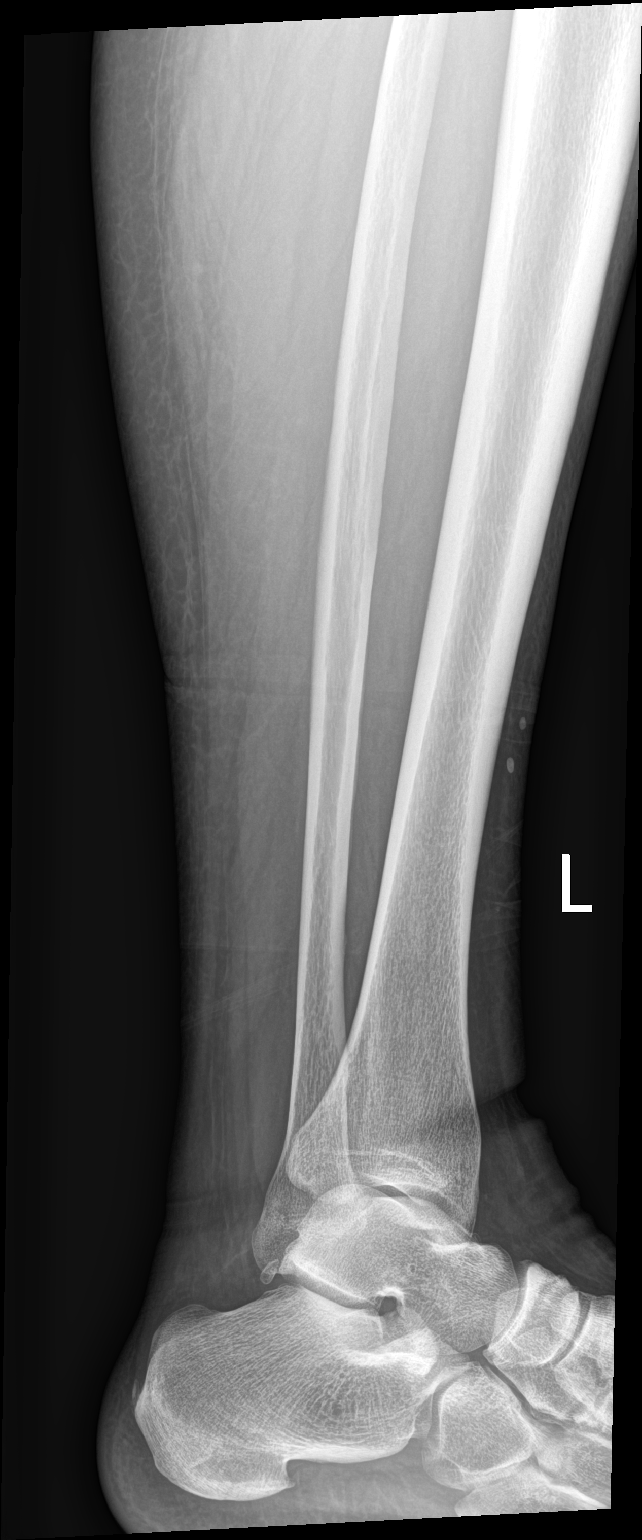

[tibia lat (2 of 2)]
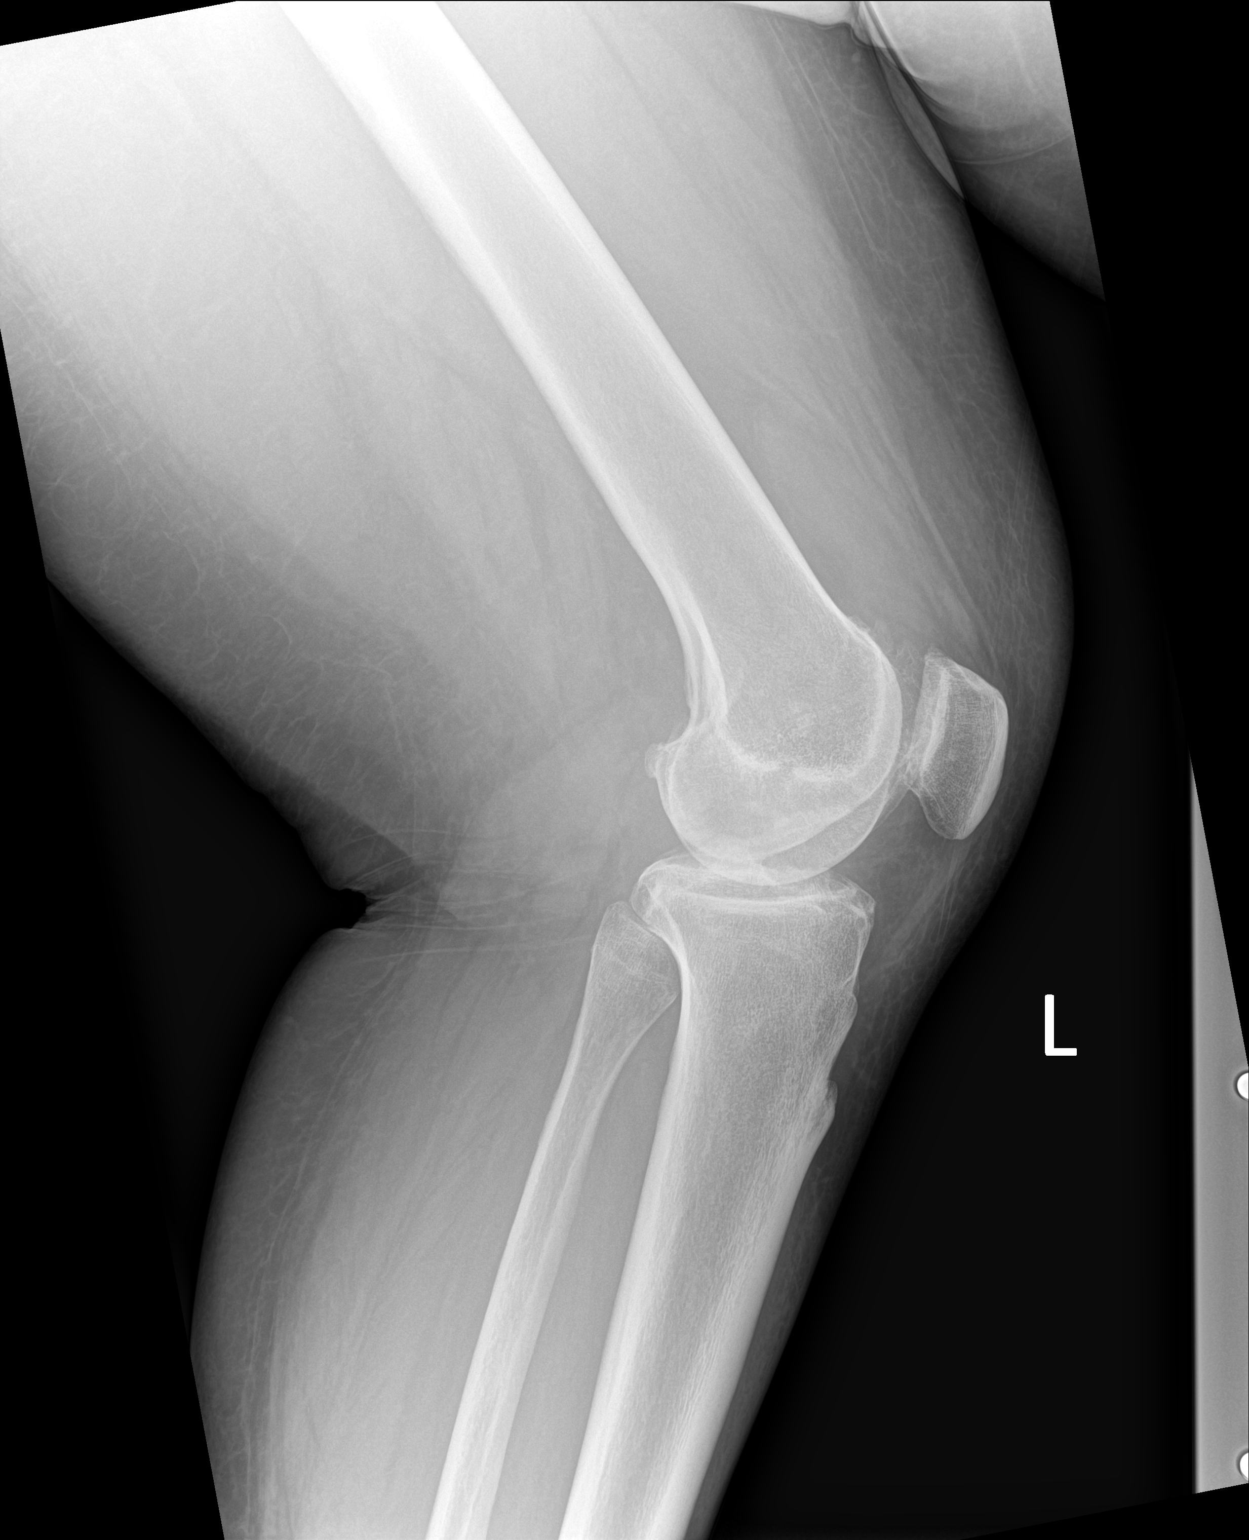

[3 of 3 positions shown; findings below may reference images not displayed]

FINDINGS: There is no evidence of fracture or other focal bone lesions. Soft
tissues are unremarkable.
IMPRESSION: No fracture or dislocation.

## 2023-08-27 IMAGING — DX DG FEMUR 2+V*L*
4 series · 4 of 4 positions shown · non-contrast
Comparison: None Available.

CLINICAL DATA: Blunt trauma to the left leg with pain, initial
encounter

EXAM:
LEFT FEMUR 2 VIEWS

[femur ap (1 of 2)]
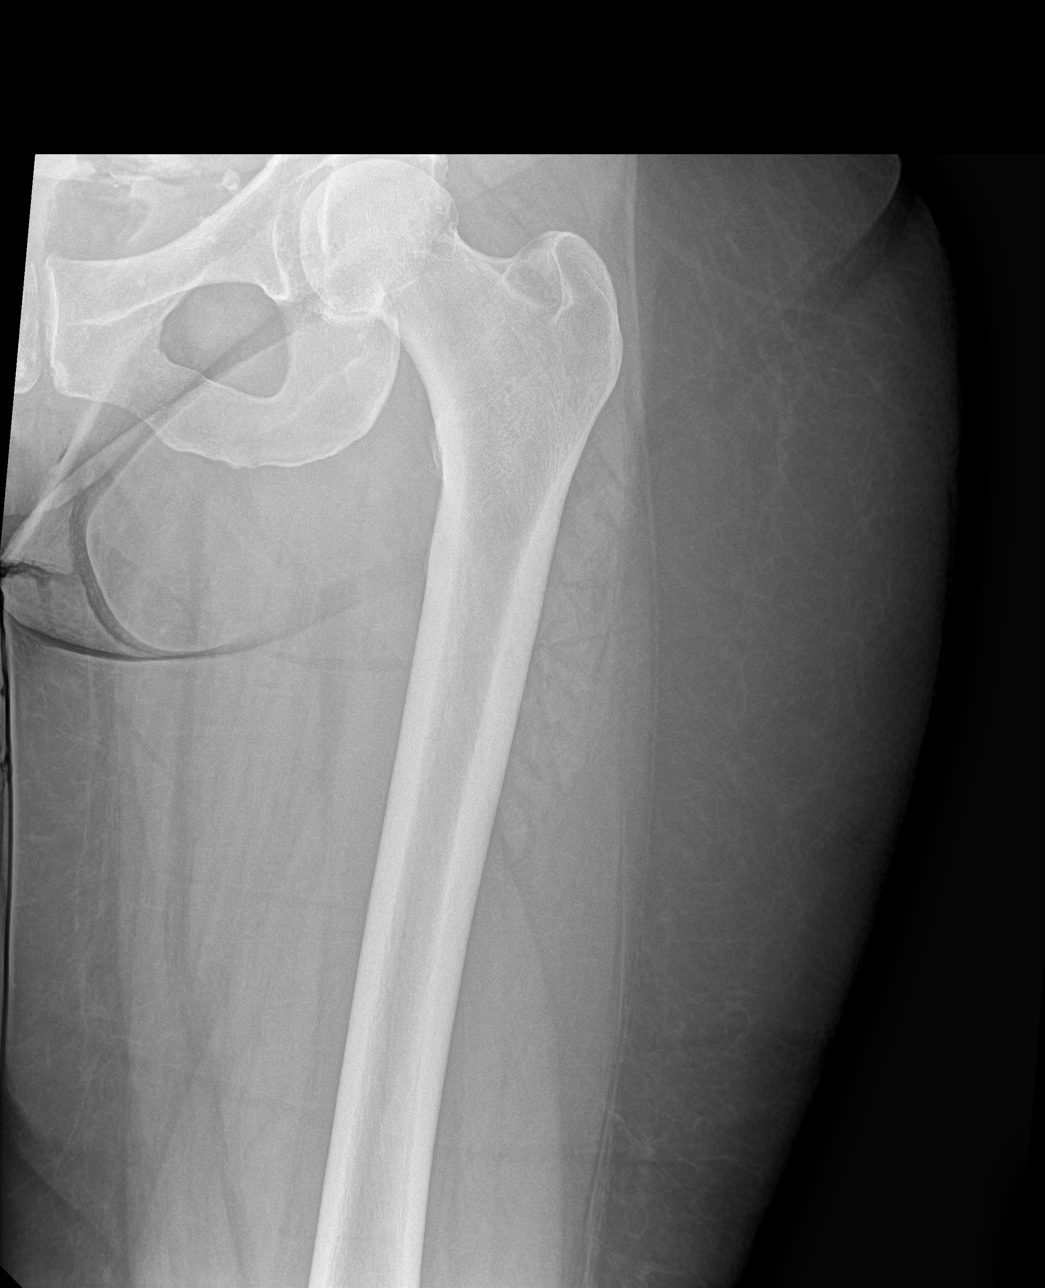

[femur ap (2 of 2)]
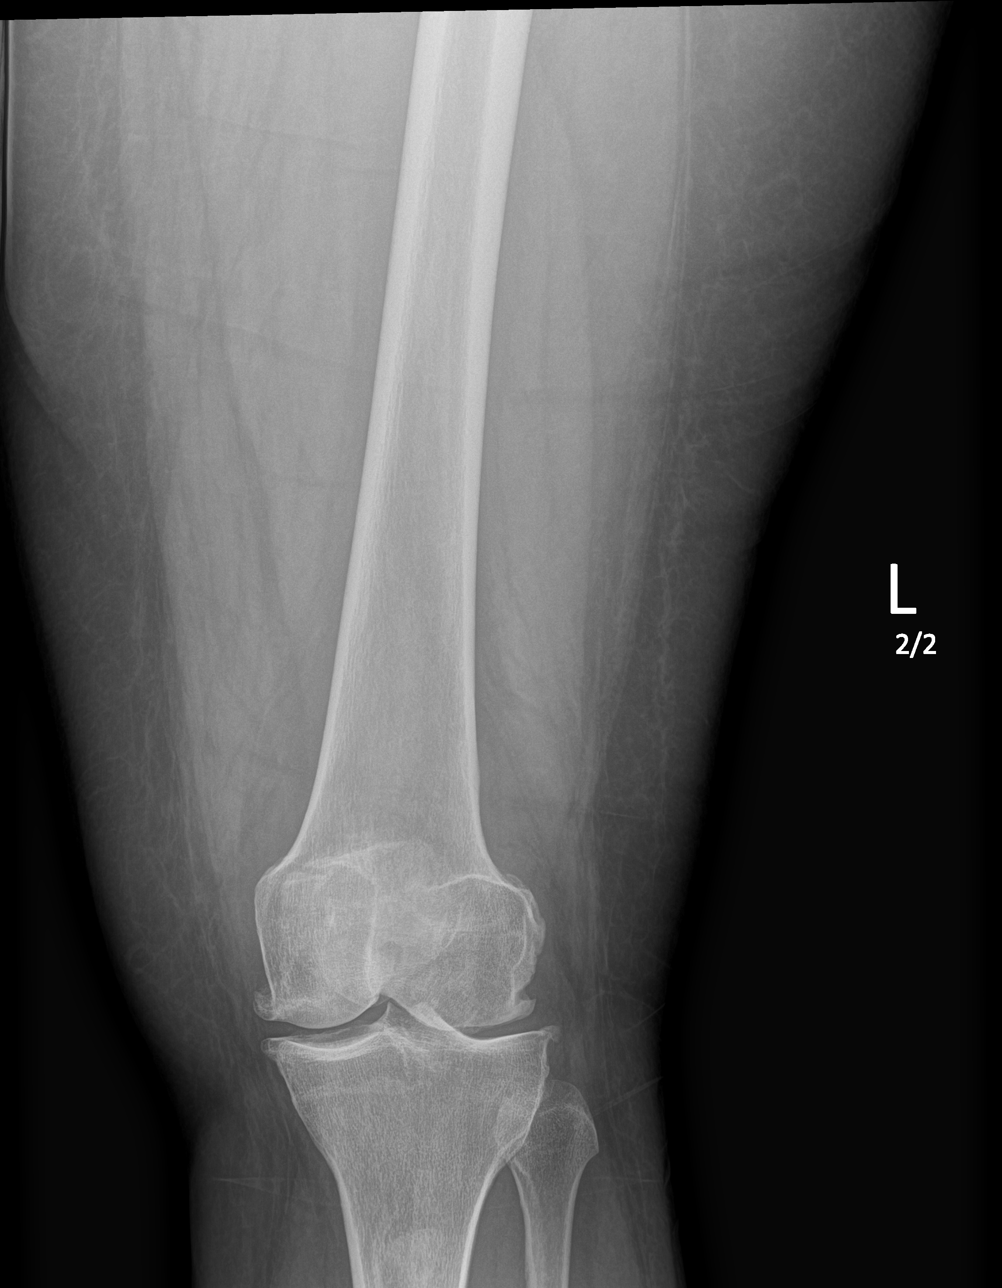

[femur lat (1 of 2)]
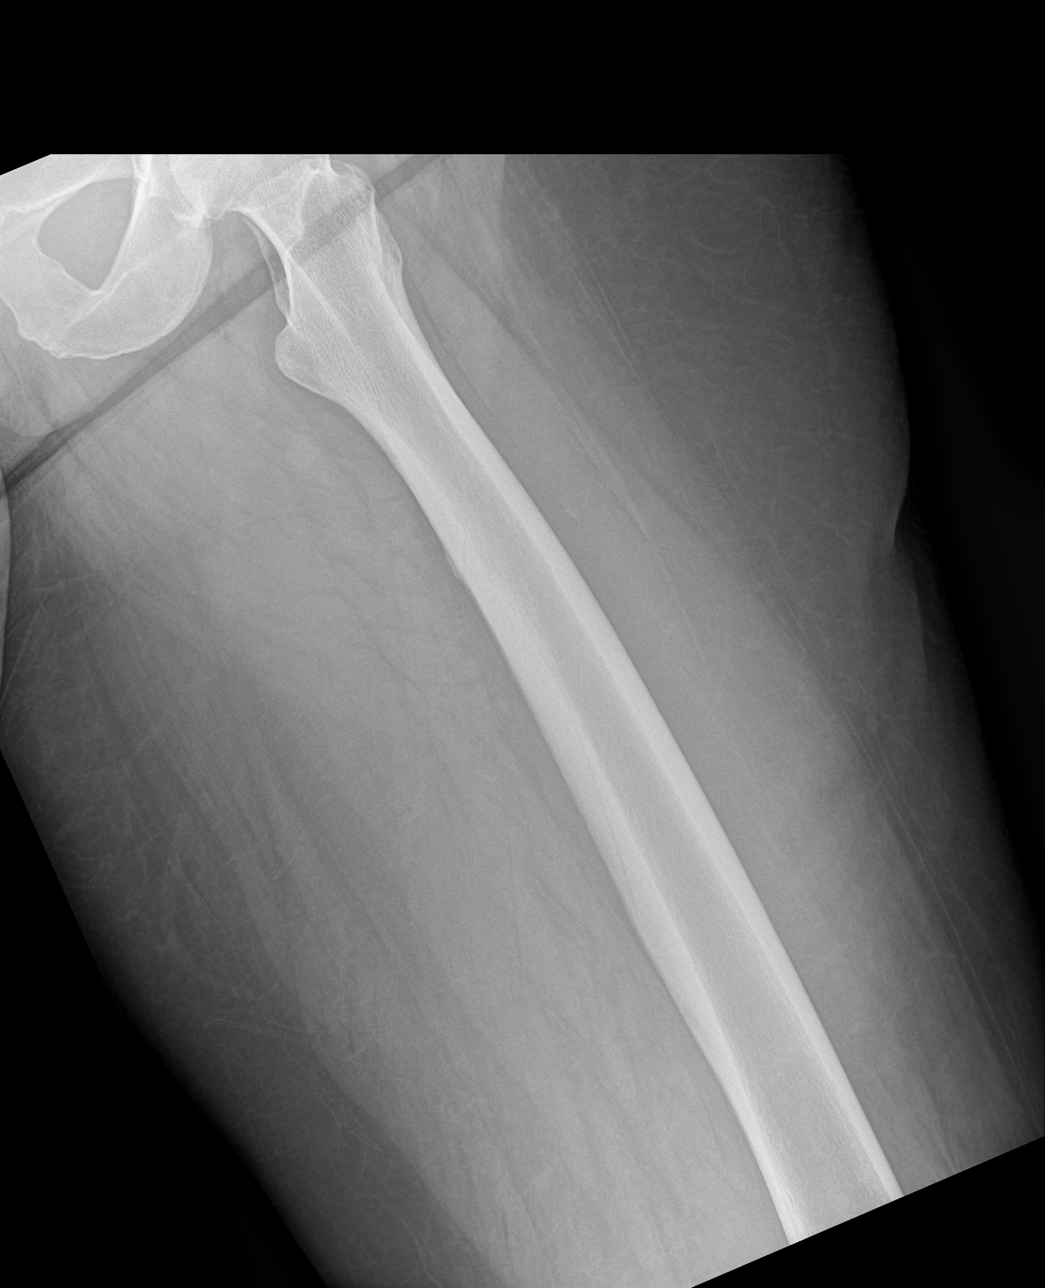

[femur lat (2 of 2)]
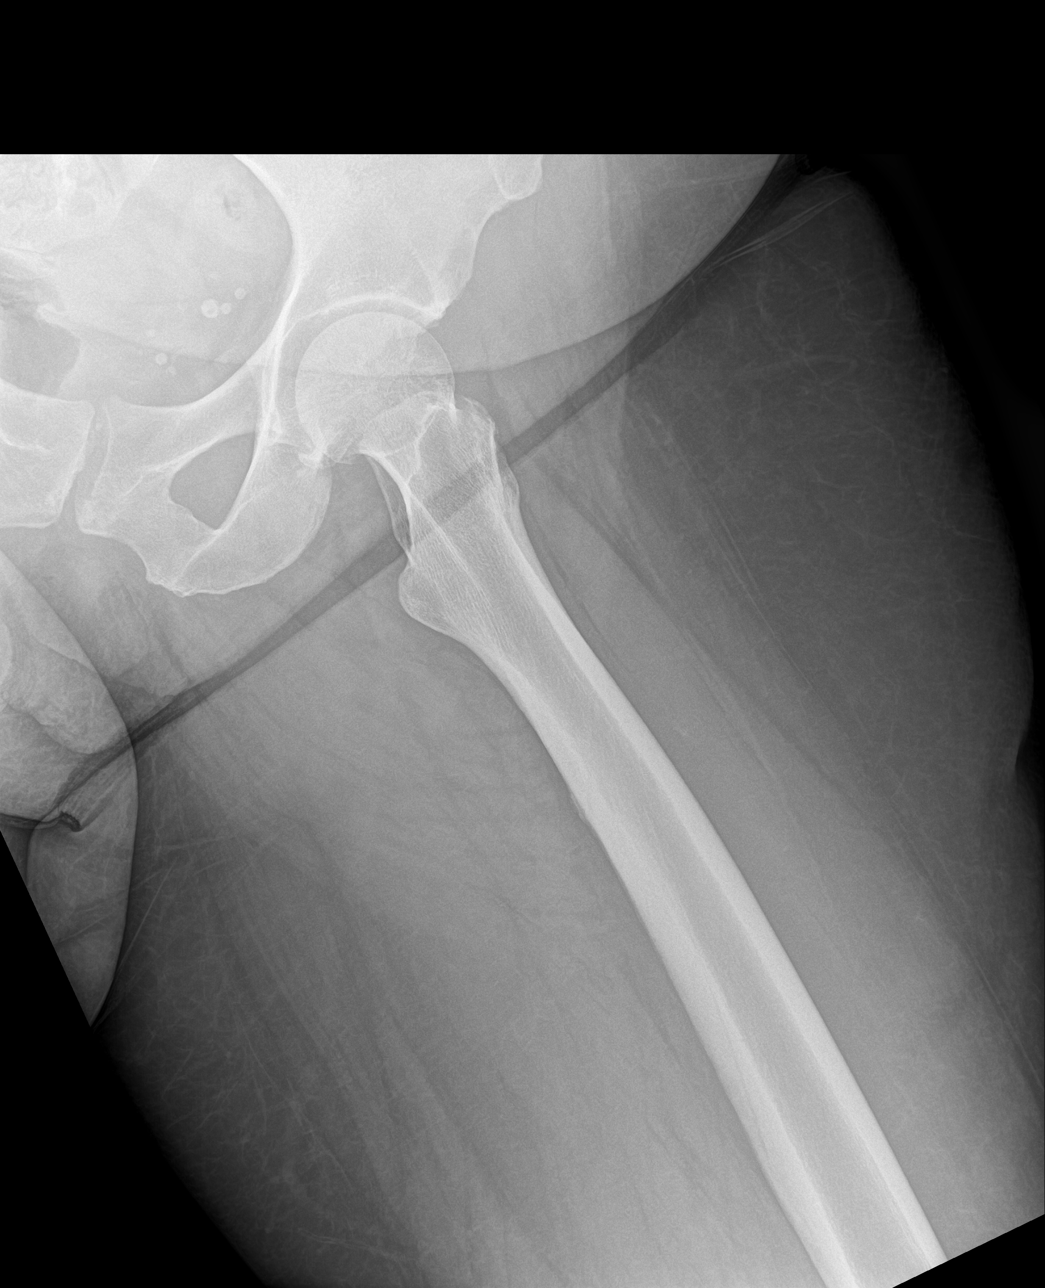

[4 of 4 positions shown; findings below may reference images not displayed]

FINDINGS: Degenerative changes of the hip and knee joints are seen. No acute
fracture or dislocation is noted. No soft tissue abnormality is
noted.
IMPRESSION: Generative change without acute abnormality.
